# Patient Record
Sex: Female | Born: 1950
Health system: Southern US, Community
[De-identification: ages and names within clinical notes are randomized; demographics above are authoritative.]

## PROBLEM LIST (undated history)

## (undated) DIAGNOSIS — G43909 Migraine, unspecified, not intractable, without status migrainosus: Secondary | ICD-10-CM

## (undated) DIAGNOSIS — M5030 Other cervical disc degeneration, unspecified cervical region: Secondary | ICD-10-CM

## (undated) DIAGNOSIS — K589 Irritable bowel syndrome without diarrhea: Secondary | ICD-10-CM

## (undated) DIAGNOSIS — F32A Depression, unspecified: Secondary | ICD-10-CM

## (undated) DIAGNOSIS — F419 Anxiety disorder, unspecified: Secondary | ICD-10-CM

## (undated) DIAGNOSIS — M21619 Bunion of unspecified foot: Secondary | ICD-10-CM

## (undated) DIAGNOSIS — M1990 Unspecified osteoarthritis, unspecified site: Secondary | ICD-10-CM

## (undated) DIAGNOSIS — Z9889 Other specified postprocedural states: Secondary | ICD-10-CM

## (undated) DIAGNOSIS — M810 Age-related osteoporosis without current pathological fracture: Secondary | ICD-10-CM

## (undated) DIAGNOSIS — H9319 Tinnitus, unspecified ear: Secondary | ICD-10-CM

## (undated) DIAGNOSIS — D131 Benign neoplasm of stomach: Secondary | ICD-10-CM

## (undated) DIAGNOSIS — D509 Iron deficiency anemia, unspecified: Secondary | ICD-10-CM

## (undated) DIAGNOSIS — N840 Polyp of corpus uteri: Secondary | ICD-10-CM

## (undated) DIAGNOSIS — L680 Hirsutism: Secondary | ICD-10-CM

## (undated) DIAGNOSIS — N6019 Diffuse cystic mastopathy of unspecified breast: Secondary | ICD-10-CM

## (undated) DIAGNOSIS — R112 Nausea with vomiting, unspecified: Secondary | ICD-10-CM

## (undated) DIAGNOSIS — F329 Major depressive disorder, single episode, unspecified: Secondary | ICD-10-CM

## (undated) DIAGNOSIS — K219 Gastro-esophageal reflux disease without esophagitis: Secondary | ICD-10-CM

## (undated) DIAGNOSIS — E785 Hyperlipidemia, unspecified: Secondary | ICD-10-CM

## (undated) HISTORY — DX: Benign neoplasm of stomach: D13.1

## (undated) HISTORY — DX: Gastro-esophageal reflux disease without esophagitis: K21.9

## (undated) HISTORY — DX: Iron deficiency anemia, unspecified: D50.9

## (undated) HISTORY — DX: Unspecified osteoarthritis, unspecified site: M19.90

## (undated) HISTORY — DX: Diffuse cystic mastopathy of unspecified breast: N60.19

## (undated) HISTORY — PX: UPPER GASTROINTESTINAL ENDOSCOPY: SHX188

## (undated) HISTORY — DX: Migraine, unspecified, not intractable, without status migrainosus: G43.909

## (undated) HISTORY — DX: Bunion of unspecified foot: M21.619

## (undated) HISTORY — DX: Age-related osteoporosis without current pathological fracture: M81.0

## (undated) HISTORY — DX: Tinnitus, unspecified ear: H93.19

## (undated) HISTORY — DX: Irritable bowel syndrome, unspecified: K58.9

## (undated) HISTORY — PX: COLONOSCOPY: SHX174

## (undated) HISTORY — DX: Major depressive disorder, single episode, unspecified: F32.9

## (undated) HISTORY — DX: Depression, unspecified: F32.A

## (undated) HISTORY — DX: Other cervical disc degeneration, unspecified cervical region: M50.30

## (undated) HISTORY — DX: Hirsutism: L68.0

## (undated) HISTORY — PX: HYSTEROSCOPY: SHX211

## (undated) HISTORY — PX: EUS: SHX5427

## (undated) HISTORY — DX: Hyperlipidemia, unspecified: E78.5

## (undated) HISTORY — DX: Polyp of corpus uteri: N84.0

---

## 1987-12-28 HISTORY — PX: TUBAL LIGATION: SHX77

## 1993-12-27 HISTORY — PX: DILATION AND CURETTAGE OF UTERUS: SHX78

## 2000-02-01 ENCOUNTER — Encounter: Payer: Self-pay | Admitting: Family Medicine

## 2000-02-01 ENCOUNTER — Encounter: Admission: RE | Admit: 2000-02-01 | Discharge: 2000-02-01 | Payer: Self-pay | Admitting: Family Medicine

## 2001-03-08 ENCOUNTER — Other Ambulatory Visit: Admission: RE | Admit: 2001-03-08 | Discharge: 2001-03-08 | Payer: Self-pay | Admitting: *Deleted

## 2001-04-10 ENCOUNTER — Encounter: Payer: Self-pay | Admitting: *Deleted

## 2001-04-10 ENCOUNTER — Encounter: Admission: RE | Admit: 2001-04-10 | Discharge: 2001-04-10 | Payer: Self-pay | Admitting: *Deleted

## 2001-04-14 ENCOUNTER — Other Ambulatory Visit: Admission: RE | Admit: 2001-04-14 | Discharge: 2001-04-14 | Payer: Self-pay | Admitting: *Deleted

## 2001-04-14 ENCOUNTER — Encounter (INDEPENDENT_AMBULATORY_CARE_PROVIDER_SITE_OTHER): Payer: Self-pay | Admitting: Specialist

## 2002-03-20 ENCOUNTER — Other Ambulatory Visit: Admission: RE | Admit: 2002-03-20 | Discharge: 2002-03-20 | Payer: Self-pay | Admitting: *Deleted

## 2002-04-11 ENCOUNTER — Encounter: Admission: RE | Admit: 2002-04-11 | Discharge: 2002-04-11 | Payer: Self-pay | Admitting: *Deleted

## 2002-04-11 ENCOUNTER — Encounter: Payer: Self-pay | Admitting: *Deleted

## 2003-03-20 ENCOUNTER — Other Ambulatory Visit: Admission: RE | Admit: 2003-03-20 | Discharge: 2003-03-20 | Payer: Self-pay | Admitting: *Deleted

## 2003-06-20 ENCOUNTER — Encounter: Admission: RE | Admit: 2003-06-20 | Discharge: 2003-06-20 | Payer: Self-pay | Admitting: *Deleted

## 2003-06-20 ENCOUNTER — Encounter: Payer: Self-pay | Admitting: *Deleted

## 2004-03-10 ENCOUNTER — Encounter: Admission: RE | Admit: 2004-03-10 | Discharge: 2004-03-10 | Payer: Self-pay | Admitting: Family Medicine

## 2004-03-23 ENCOUNTER — Other Ambulatory Visit: Admission: RE | Admit: 2004-03-23 | Discharge: 2004-03-23 | Payer: Self-pay | Admitting: *Deleted

## 2004-06-22 ENCOUNTER — Ambulatory Visit (HOSPITAL_COMMUNITY): Admission: RE | Admit: 2004-06-22 | Discharge: 2004-06-22 | Payer: Self-pay | Admitting: *Deleted

## 2004-08-03 ENCOUNTER — Ambulatory Visit (HOSPITAL_COMMUNITY): Admission: RE | Admit: 2004-08-03 | Discharge: 2004-08-03 | Payer: Self-pay | Admitting: Internal Medicine

## 2005-03-10 ENCOUNTER — Other Ambulatory Visit: Admission: RE | Admit: 2005-03-10 | Discharge: 2005-03-10 | Payer: Self-pay | Admitting: *Deleted

## 2005-09-29 ENCOUNTER — Ambulatory Visit (HOSPITAL_COMMUNITY): Admission: RE | Admit: 2005-09-29 | Discharge: 2005-09-29 | Payer: Self-pay | Admitting: *Deleted

## 2006-04-06 ENCOUNTER — Other Ambulatory Visit: Admission: RE | Admit: 2006-04-06 | Discharge: 2006-04-06 | Payer: Self-pay | Admitting: *Deleted

## 2006-09-08 ENCOUNTER — Ambulatory Visit: Payer: Self-pay | Admitting: Internal Medicine

## 2006-09-21 ENCOUNTER — Ambulatory Visit (HOSPITAL_COMMUNITY): Admission: RE | Admit: 2006-09-21 | Discharge: 2006-09-21 | Payer: Self-pay | Admitting: *Deleted

## 2006-09-23 ENCOUNTER — Ambulatory Visit: Payer: Self-pay | Admitting: Internal Medicine

## 2006-10-05 ENCOUNTER — Ambulatory Visit (HOSPITAL_COMMUNITY): Admission: RE | Admit: 2006-10-05 | Discharge: 2006-10-05 | Payer: Self-pay | Admitting: *Deleted

## 2006-10-19 ENCOUNTER — Ambulatory Visit (HOSPITAL_COMMUNITY): Admission: RE | Admit: 2006-10-19 | Discharge: 2006-10-19 | Payer: Self-pay | Admitting: Gastroenterology

## 2006-10-25 ENCOUNTER — Ambulatory Visit: Payer: Self-pay | Admitting: Gastroenterology

## 2006-10-31 ENCOUNTER — Ambulatory Visit: Payer: Self-pay | Admitting: Internal Medicine

## 2007-04-11 ENCOUNTER — Other Ambulatory Visit: Admission: RE | Admit: 2007-04-11 | Discharge: 2007-04-11 | Payer: Self-pay | Admitting: *Deleted

## 2007-05-11 ENCOUNTER — Encounter: Admission: RE | Admit: 2007-05-11 | Discharge: 2007-05-11 | Payer: Self-pay | Admitting: Family Medicine

## 2007-05-17 ENCOUNTER — Encounter: Admission: RE | Admit: 2007-05-17 | Discharge: 2007-05-17 | Payer: Self-pay | Admitting: Family Medicine

## 2007-10-09 ENCOUNTER — Ambulatory Visit (HOSPITAL_COMMUNITY): Admission: RE | Admit: 2007-10-09 | Discharge: 2007-10-09 | Payer: Self-pay | Admitting: *Deleted

## 2008-03-16 ENCOUNTER — Emergency Department (HOSPITAL_COMMUNITY): Admission: EM | Admit: 2008-03-16 | Discharge: 2008-03-16 | Payer: Self-pay | Admitting: Emergency Medicine

## 2008-05-09 ENCOUNTER — Other Ambulatory Visit: Admission: RE | Admit: 2008-05-09 | Discharge: 2008-05-09 | Payer: Self-pay | Admitting: Gynecology

## 2008-10-09 ENCOUNTER — Ambulatory Visit (HOSPITAL_COMMUNITY): Admission: RE | Admit: 2008-10-09 | Discharge: 2008-10-09 | Payer: Self-pay | Admitting: Gynecology

## 2009-02-10 ENCOUNTER — Encounter: Admission: RE | Admit: 2009-02-10 | Discharge: 2009-02-10 | Payer: Self-pay | Admitting: Family Medicine

## 2009-07-30 ENCOUNTER — Other Ambulatory Visit: Admission: RE | Admit: 2009-07-30 | Discharge: 2009-07-30 | Payer: Self-pay | Admitting: Family Medicine

## 2009-08-06 ENCOUNTER — Ambulatory Visit (HOSPITAL_COMMUNITY): Admission: RE | Admit: 2009-08-06 | Discharge: 2009-08-06 | Payer: Self-pay | Admitting: Dermatology

## 2009-10-07 ENCOUNTER — Encounter (INDEPENDENT_AMBULATORY_CARE_PROVIDER_SITE_OTHER): Payer: Self-pay | Admitting: Obstetrics and Gynecology

## 2009-10-07 ENCOUNTER — Ambulatory Visit (HOSPITAL_COMMUNITY): Admission: RE | Admit: 2009-10-07 | Discharge: 2009-10-07 | Payer: Self-pay | Admitting: Obstetrics and Gynecology

## 2009-10-10 ENCOUNTER — Ambulatory Visit (HOSPITAL_COMMUNITY): Admission: RE | Admit: 2009-10-10 | Discharge: 2009-10-10 | Payer: Self-pay | Admitting: Family Medicine

## 2010-10-12 ENCOUNTER — Ambulatory Visit (HOSPITAL_COMMUNITY): Admission: RE | Admit: 2010-10-12 | Discharge: 2010-10-12 | Payer: Self-pay | Admitting: Family Medicine

## 2010-10-20 ENCOUNTER — Other Ambulatory Visit: Admission: RE | Admit: 2010-10-20 | Discharge: 2010-10-20 | Payer: Self-pay | Admitting: Family Medicine

## 2011-04-01 LAB — COMPREHENSIVE METABOLIC PANEL
Albumin: 4.5 g/dL (ref 3.5–5.2)
Alkaline Phosphatase: 44 U/L (ref 39–117)
BUN: 12 mg/dL (ref 6–23)
CO2: 31 mEq/L (ref 19–32)
Chloride: 106 mEq/L (ref 96–112)
GFR calc non Af Amer: >60 mL/min (ref >60)
Glucose, Bld: 78 mg/dL (ref 70–99)
Potassium: 4.7 mEq/L (ref 3.5–5.1)
Total Bilirubin: 0.9 mg/dL (ref 0.3–1.2)

## 2011-04-01 LAB — CBC
HCT: 41.9 % (ref 36.0–46.0)
Platelets: 167 10*3/uL (ref 150–400)
RBC: 4.8 MIL/uL (ref 3.87–5.11)
WBC: 5.3 10*3/uL (ref 4.0–10.5)

## 2011-05-14 NOTE — Assessment & Plan Note (Signed)
[facility]                           GASTROENTEROLOGY OFFICE NOTE   Audrey Powers, Audrey Powers                   MRN:          [id number]  DATE:09/08/2006                            DOB:          1951-02-16    REASON FOR CONSULTATION:  Recurrent abdominal pain, elevated amylase and  lipase.   ASSESSMENT:  1. Epigastric pain and left upper quadrant pain.  2. Chronically elevated amylase and lipase related to the pancreas, not      macroamylasemia.  3. Gallstones.  4. MRI and MRCP in 2005 suggestive of pancreas divisum.  She could have      chronic low-grade pancreatitis.   RECOMMENDATIONS AND PLAN:  1. EGD, as she has had increasing symptoms that have responded to      Prilosec.  Look for chronic esophageal damage, ulcer disease, tumor,      though this seems unlikely.  2. I think we may need to consider repeat imaging of the pancreas with an      MRI or MRCP.  3. She does not really seem to have discrete spells of severe pancreatitis      to suggest the need for cholecystectomy, though that is something to      keep in mind.   The risks, benefits, and indications of EGD are explained to the patient.   HISTORY:  Over the past several months, she has had to increase to two  Prilosec a day to control her epigastric and left upper quadrant pain which  does reasonably well.  She does not describe dysphagia or bleeding.  There  is no fever, chills, weight fluctuation, or weight loss.  No arthritic  symptoms.  She has not had discrete episodes of abdominal pain.  Several  months ago, she had nausea/vomiting problems and some diarrhea that her  husband had as well, and this is attributed to a virus.  Recent  investigation from Dr. Duaine Dredge has shown persistent elevation of amylase  and lipase in the past at a low-grade level.  Her CBC, CMET were normal on  August 16, 2006.  Total cholesterol 204, triglycerides 134, LDL 135.  Amylase 115, lipase 103,  with top normal being 99 and 59.  In March, amylase  was 108.  Her pancreatic fraction was 91%.  There was no macroamylase.  Her  triglycerides were 155 at that time, and her lipase was 84.  TSH normal.  Amylase 107, lipase 96 on August 23, 2005.  Lipase 101, amylase 96 on August 14, 2004.   MEDICATIONS:  1. Prilosec OTC 20 mg two a day.  2. Atenolol 100 mg daily.  3. Amitriptyline 10 mg daily.  4. Calcium 600 mg with vitamin D daily.  5. Simvastatin 10 mg daily.  6. Dicyclomine 10 mg as needed.  7. Maxalt p.r.n.   ALLERGIES:  CODEINE CAUSES NAUSEA AND VOMITING.  SHE IS SENSITIVE TO  ERYTHROMYCIN.   PAST MEDICAL HISTORY:  1. Chronic headaches.  2. Cholelithiasis (single large gallstone).  3. Osteoarthritis.  4. Prior tubal ligation.  5. Chronic elevation of amylase and lipase with suspected pancreas  divisum, as described above.  6. Suspected gastroesophageal reflux disease.   PHYSICAL EXAMINATION:  GENERAL:  Well-developed, well-nourished white woman  in no acute distress.  VITAL SIGNS:  Weight 122 pounds.  She was 116 pounds in 2005.  Pulse 68,  blood pressure 102/52.  HEENT:  Eyes anicteric.  NECK:  Supple.  CHEST:  Clear, resonant.  HEART:  S1 and S2.  No rubs or gallops.  EXTREMITIES:  Without edema in the lower extremities.  ABDOMEN:  Soft, nontender, without organomegaly, mass, or hernia.  LYMPHATIC:  No neck or supraclavicular nodes.  MUSCULOSKELETAL:  There is no CVA tenderness.  PSYCHIATRIC:  She is alert and oriented x3.   Please see my medical history and physical form for full details of this  visit.  This is filed in the chart.   I appreciate the opportunity to care for this patient.                                   Iva Boop, MD,FACG   CEG/MedQ  DD:  09/08/2006  DT:  09/09/2006  Job #:  [id number]   cc:   Mosetta Putt, M.D.

## 2011-05-14 NOTE — Assessment & Plan Note (Signed)
[facility]                           GASTROENTEROLOGY OFFICE NOTE   KIDADA, GING                   MRN:          [id number]  DATE:10/31/2006                            DOB:          1951-07-22    Followup of abdominal pain.  Elevated amylase, lipase.   [name] is doing well at this time.  Dr. Duaine Dredge has had her taper  from 2 to 1 Prilosec OTC a day.  Her other medications are listed in the  chart.  She has had mild chronic elevation of amylase and lipase of unclear  etiology.  I had thought she might have a pancreas divisum based on an MRCP.  She underwent endoscopic ultrasound of the pancreas on October 19, 2006 by  Dr. Christella Hartigan and it looked normal, without evidence of divisum.  There is no  mass, no pancreatitis.   I have explained this to her.  She is reassured.  She is feeling well at  this time.   Weight 119 pounds.  Pulse 64, blood pressure 98/58.   ASSESSMENT:  Abnormal pancreatic enzymes of unclear significance.  Abdominal  pain in the epigastrium and left upper quadrant resolved.   She probably has at least some functional bowel disorder.  As far as her  elevated enzymes, I am at a loss to explain this but there does not seem to  be a significant etiology and I would not test this further at this time.  Perhaps these are normal values for her and that she lies somewhat outside  the upper limits of normal standard deviations that are included in normal  tests.  They do seem to be of pancreatic origin based upon isoenzymes and  she does not seem to have macroamylasemia.  She has gallstones but I do not  think these have been symptomatic.   I will see her back as needed. She may try to get off of Prilosec completely  by tapering off, as that is not an essential medicine.     Iva Boop, MD,FACG  Electronically Signed    CEG/MedQ  DD: 10/31/2006  DT: 11/01/2006  Job #: [id number]   cc:   Mosetta Putt, M.D.

## 2011-09-17 ENCOUNTER — Other Ambulatory Visit (HOSPITAL_COMMUNITY): Payer: Self-pay | Admitting: Family Medicine

## 2011-09-17 DIAGNOSIS — Z1231 Encounter for screening mammogram for malignant neoplasm of breast: Secondary | ICD-10-CM

## 2011-10-15 ENCOUNTER — Ambulatory Visit (HOSPITAL_COMMUNITY)
Admission: RE | Admit: 2011-10-15 | Discharge: 2011-10-15 | Disposition: A | Discharge status: Home / Self Care | Payer: BC Managed Care – PPO | Source: Ambulatory Visit | Attending: Family Medicine | Admitting: Family Medicine

## 2011-10-15 DIAGNOSIS — Z1231 Encounter for screening mammogram for malignant neoplasm of breast: Secondary | ICD-10-CM | POA: Insufficient documentation

## 2011-11-01 ENCOUNTER — Other Ambulatory Visit: Payer: Self-pay | Admitting: Family Medicine

## 2011-11-01 ENCOUNTER — Other Ambulatory Visit (HOSPITAL_COMMUNITY)
Admission: RE | Admit: 2011-11-01 | Discharge: 2011-11-01 | Disposition: A | Discharge status: Home / Self Care | Payer: BC Managed Care – PPO | Source: Ambulatory Visit | Attending: Family Medicine | Admitting: Family Medicine

## 2011-11-01 DIAGNOSIS — Z0000 Encounter for general adult medical examination without abnormal findings: Secondary | ICD-10-CM | POA: Insufficient documentation

## 2012-09-13 ENCOUNTER — Other Ambulatory Visit (HOSPITAL_COMMUNITY): Payer: Self-pay | Admitting: Family Medicine

## 2012-09-13 DIAGNOSIS — Z1231 Encounter for screening mammogram for malignant neoplasm of breast: Secondary | ICD-10-CM

## 2012-10-16 ENCOUNTER — Ambulatory Visit (HOSPITAL_COMMUNITY)
Admission: RE | Admit: 2012-10-16 | Discharge: 2012-10-16 | Disposition: A | Discharge status: Home / Self Care | Payer: BC Managed Care – PPO | Source: Ambulatory Visit | Attending: Family Medicine | Admitting: Family Medicine

## 2012-10-16 DIAGNOSIS — Z1231 Encounter for screening mammogram for malignant neoplasm of breast: Secondary | ICD-10-CM

## 2012-12-01 ENCOUNTER — Other Ambulatory Visit (HOSPITAL_COMMUNITY)
Admission: RE | Admit: 2012-12-01 | Discharge: 2012-12-01 | Disposition: A | Discharge status: Home / Self Care | Payer: BC Managed Care – PPO | Source: Ambulatory Visit | Attending: Family Medicine | Admitting: Family Medicine

## 2012-12-01 ENCOUNTER — Other Ambulatory Visit: Payer: Self-pay | Admitting: Family Medicine

## 2012-12-01 DIAGNOSIS — Z0000 Encounter for general adult medical examination without abnormal findings: Secondary | ICD-10-CM | POA: Insufficient documentation

## 2013-09-17 ENCOUNTER — Other Ambulatory Visit (HOSPITAL_COMMUNITY): Payer: Self-pay | Admitting: Family Medicine

## 2013-09-17 DIAGNOSIS — Z1231 Encounter for screening mammogram for malignant neoplasm of breast: Secondary | ICD-10-CM

## 2013-10-19 ENCOUNTER — Ambulatory Visit (HOSPITAL_COMMUNITY)
Admission: RE | Admit: 2013-10-19 | Discharge: 2013-10-19 | Disposition: A | Discharge status: Home / Self Care | Payer: BC Managed Care – PPO | Source: Ambulatory Visit | Attending: Family Medicine | Admitting: Family Medicine

## 2013-10-19 DIAGNOSIS — Z1231 Encounter for screening mammogram for malignant neoplasm of breast: Secondary | ICD-10-CM | POA: Insufficient documentation

## 2014-03-05 ENCOUNTER — Other Ambulatory Visit: Payer: Self-pay | Admitting: Dermatology

## 2014-05-23 ENCOUNTER — Encounter: Payer: Self-pay | Admitting: Internal Medicine

## 2014-05-30 ENCOUNTER — Other Ambulatory Visit (HOSPITAL_COMMUNITY)
Admission: RE | Admit: 2014-05-30 | Discharge: 2014-05-30 | Disposition: A | Discharge status: Home / Self Care | Payer: BC Managed Care – PPO | Source: Ambulatory Visit | Attending: Family Medicine | Admitting: Family Medicine

## 2014-05-30 ENCOUNTER — Other Ambulatory Visit: Payer: Self-pay | Admitting: Family Medicine

## 2014-05-30 DIAGNOSIS — Z0000 Encounter for general adult medical examination without abnormal findings: Secondary | ICD-10-CM | POA: Insufficient documentation

## 2014-05-31 LAB — CYTOLOGY - PAP

## 2014-06-03 ENCOUNTER — Encounter: Payer: Self-pay | Admitting: Internal Medicine

## 2014-06-04 ENCOUNTER — Encounter: Payer: Self-pay | Admitting: Internal Medicine

## 2014-08-05 ENCOUNTER — Ambulatory Visit (AMBULATORY_SURGERY_CENTER): Payer: Medicare Other | Admitting: *Deleted

## 2014-08-05 VITALS — Ht 62.0 in | Wt 118.8 lb

## 2014-08-05 DIAGNOSIS — Z1211 Encounter for screening for malignant neoplasm of colon: Secondary | ICD-10-CM

## 2014-08-05 MED ORDER — NA SULFATE-K SULFATE-MG SULF 17.5-3.13-1.6 GM/177ML PO SOLN: ORAL | Status: DC

## 2014-08-05 NOTE — Progress Notes (Signed)
No allergies to eggs or soy. No problems with anesthesia.  Pt given Emmi instructions for colonoscopy  No oxygen use  No diet drug use  

## 2014-08-09 ENCOUNTER — Encounter: Payer: Self-pay | Admitting: Internal Medicine

## 2014-08-19 ENCOUNTER — Ambulatory Visit (AMBULATORY_SURGERY_CENTER): Payer: Medicare Other | Admitting: Internal Medicine

## 2014-08-19 ENCOUNTER — Encounter: Payer: Self-pay | Admitting: Internal Medicine

## 2014-08-19 VITALS — BP 116/62 | HR 63 | Temp 98.6°F | Resp 19 | Ht 62.0 in | Wt 118.0 lb

## 2014-08-19 DIAGNOSIS — Z1211 Encounter for screening for malignant neoplasm of colon: Secondary | ICD-10-CM

## 2014-08-19 MED ORDER — SODIUM CHLORIDE 0.9 % IV SOLN: 500.0000 mL | INTRAVENOUS | Status: DC

## 2014-08-19 NOTE — Op Note (Signed)
[facility] [place]  [address] [place], [address]   COLONOSCOPY PROCEDURE REPORT  PATIENT: Audrey Powers, Audrey Powers  MR#: [id number] BIRTHDATE: 1951-01-19 , 29  yrs. old GENDER: Female ENDOSCOPIST: Gatha Mayer, MD, [facility] PROCEDURE DATE:  08/19/2014 PROCEDURE:   Colonoscopy, surveillance First Screening Colonoscopy - Avg.  risk and is 50 yrs.  old or older - No.  Prior Negative Screening - Now for repeat screening. N/A  History of Adenoma - Now for follow-up colonoscopy & has been > or = to 3 yrs.  N/A  Polyps Removed Today? No.  Recommend repeat exam, <10 yrs? No. ASA CLASS:   Class II INDICATIONS:average risk screening and Last colonoscopy performed 10 years ago. MEDICATIONS: Propofol (Diprivan) 120 mg IV, MAC sedation, administered by CRNA, and These medications were titrated to patient response per physician's verbal order  DESCRIPTION OF PROCEDURE:   After the risks benefits and alternatives of the procedure were thoroughly explained, informed consent was obtained.  A digital rectal exam revealed no abnormalities of the rectum.   The LB [id number]  endoscope was introduced through the anus and advanced to the cecum, which was identified by both the appendix and ileocecal valve. No adverse events experienced.   The quality of the prep was excellent using Suprep  The instrument was then slowly withdrawn as the colon was fully examined.      COLON FINDINGS: A normal appearing cecum, ileocecal valve, and appendiceal orifice were identified.  The ascending, hepatic flexure, transverse, splenic flexure, descending, sigmoid colon and rectum appeared unremarkable.  No polyps or cancers were seen.   A right colon retroflexion was performed.  Retroflexed views revealed no abnormalities. The time to cecum=3 minutes 45 seconds. Withdrawal time=8 minutes 40 seconds.  The scope was withdrawn and the procedure completed. COMPLICATIONS: There were no  complications.  ENDOSCOPIC IMPRESSION: Normal colonoscopy - excellent prep  RECOMMENDATIONS: Repeat colonoscopy 10 years - 2025   eSigned:  Gatha Mayer, MD, [facility] 08/19/2014 9:07 AM   cc: Harlan Stains, MD and The Patient

## 2014-08-19 NOTE — Progress Notes (Signed)
No problems noted in the recovery room. maw 

## 2014-08-19 NOTE — Progress Notes (Signed)
A/ox3 pleased with MAC, report to Annette RN 

## 2014-08-19 NOTE — Patient Instructions (Addendum)
No polyps today!  Next routine colonoscopy in 10 years - 2025   I appreciate the opportunity to care for you. Gatha Mayer, MD, FACG    YOU HAD AN ENDOSCOPIC PROCEDURE TODAY AT [facility]: Refer to the procedure report that was given to you for any specific questions about what was found during the examination.  If the procedure report does not answer your questions, please call your gastroenterologist to clarify.  If you requested that your care partner not be given the details of your procedure findings, then the procedure report has been included in a sealed envelope for you to review at your convenience later.  YOU SHOULD EXPECT: Some feelings of bloating in the abdomen. Passage of more gas than usual.  Walking can help get rid of the air that was put into your GI tract during the procedure and reduce the bloating. If you had a lower endoscopy (such as a colonoscopy or flexible sigmoidoscopy) you may notice spotting of blood in your stool or on the toilet paper. If you underwent a bowel prep for your procedure, then you may not have a normal bowel movement for a few days.  DIET: Your first meal following the procedure should be a light meal and then it is ok to progress to your normal diet.  A half-sandwich or bowl of soup is an example of a good first meal.  Heavy or fried foods are harder to digest and may make you feel nauseous or bloated.  Likewise meals heavy in dairy and vegetables can cause extra gas to form and this can also increase the bloating.  Drink plenty of fluids but you should avoid alcoholic beverages for 24 hours.  ACTIVITY: Your care partner should take you home directly after the procedure.  You should plan to take it easy, moving slowly for the rest of the day.  You can resume normal activity the day after the procedure however you should NOT DRIVE or use heavy machinery for 24 hours (because of the sedation medicines used during the test).    SYMPTOMS  TO REPORT IMMEDIATELY: A gastroenterologist can be reached at any hour.  During normal business hours, 8:30 AM to 5:00 PM Monday through Friday, call [phone].  After hours and on weekends, please call the GI answering service at [phone] who will take a message and have the physician on call contact you.   Following lower endoscopy (colonoscopy or flexible sigmoidoscopy):  Excessive amounts of blood in the stool  Significant tenderness or worsening of abdominal pains  Swelling of the abdomen that is new, acute  Fever of 100F or higher FOLLOW UP: If any biopsies were taken you will be contacted by phone or by letter within the next 1-3 weeks.  Call your gastroenterologist if you have not heard about the biopsies in 3 weeks.  Our staff will call the home number listed on your records the next business day following your procedure to check on you and address any questions or concerns that you may have at that time regarding the information given to you following your procedure. This is a courtesy call and so if there is no answer at the home number and we have not heard from you through the emergency physician on call, we will assume that you have returned to your regular daily activities without incident.  SIGNATURES/CONFIDENTIALITY: You and/or your care partner have signed paperwork which will be entered into your electronic medical record.  These  signatures attest to the fact that that the information above on your After Visit Summary has been reviewed and is understood.  Full responsibility of the confidentiality of this discharge information lies with you and/or your care-partner.     Repeat next colonoscopy in 10 years! You may resume your current medications today. Please call if any questions or concerns.

## 2014-08-20 ENCOUNTER — Telehealth: Payer: Self-pay | Admitting: *Deleted

## 2014-08-20 NOTE — Telephone Encounter (Signed)
  Follow up Call-  Call back number 08/19/2014  Post procedure Call Back phone  # [phone]  Permission to leave phone message Yes     Patient questions:  Do you have a fever, pain , or abdominal swelling? No. Pain Score  0 *  Have you tolerated food without any problems? Yes.    Have you been able to return to your normal activities? Yes.    Do you have any questions about your discharge instructions: Diet   No. Medications  No. Follow up visit  No.  Do you have questions or concerns about your Care? No.  Actions: * If pain score is 4 or above: No action needed, pain <4.

## 2014-10-03 ENCOUNTER — Other Ambulatory Visit (HOSPITAL_COMMUNITY): Payer: Self-pay | Admitting: Family Medicine

## 2014-10-03 DIAGNOSIS — Z1231 Encounter for screening mammogram for malignant neoplasm of breast: Secondary | ICD-10-CM

## 2014-10-22 ENCOUNTER — Other Ambulatory Visit (HOSPITAL_COMMUNITY): Payer: Self-pay | Admitting: Family Medicine

## 2014-10-22 ENCOUNTER — Ambulatory Visit (HOSPITAL_COMMUNITY)
Admission: RE | Admit: 2014-10-22 | Discharge: 2014-10-22 | Disposition: A | Discharge status: Home / Self Care | Payer: Medicare Other | Source: Ambulatory Visit | Attending: Family Medicine | Admitting: Family Medicine

## 2014-10-22 DIAGNOSIS — Z1231 Encounter for screening mammogram for malignant neoplasm of breast: Secondary | ICD-10-CM

## 2015-09-24 ENCOUNTER — Other Ambulatory Visit: Payer: Self-pay

## 2015-09-24 DIAGNOSIS — Z1231 Encounter for screening mammogram for malignant neoplasm of breast: Secondary | ICD-10-CM

## 2015-10-27 ENCOUNTER — Ambulatory Visit
Admission: RE | Admit: 2015-10-27 | Discharge: 2015-10-27 | Disposition: A | Discharge status: Home / Self Care | Payer: Medicare Other | Source: Ambulatory Visit

## 2015-10-27 DIAGNOSIS — Z1231 Encounter for screening mammogram for malignant neoplasm of breast: Secondary | ICD-10-CM

## 2016-06-11 ENCOUNTER — Other Ambulatory Visit: Payer: Self-pay | Admitting: Family Medicine

## 2016-06-11 DIAGNOSIS — R1010 Upper abdominal pain, unspecified: Secondary | ICD-10-CM

## 2016-06-18 ENCOUNTER — Ambulatory Visit
Admission: RE | Admit: 2016-06-18 | Discharge: 2016-06-18 | Disposition: A | Discharge status: Home / Self Care | Payer: Medicare Other | Source: Ambulatory Visit | Attending: Family Medicine | Admitting: Family Medicine

## 2016-06-18 DIAGNOSIS — R1010 Upper abdominal pain, unspecified: Secondary | ICD-10-CM

## 2016-06-24 ENCOUNTER — Telehealth: Payer: Self-pay | Admitting: Internal Medicine

## 2016-06-24 NOTE — Telephone Encounter (Signed)
Pt has been added to the wait list and told to call back if symptoms worsen.

## 2016-08-27 DIAGNOSIS — D131 Benign neoplasm of stomach: Secondary | ICD-10-CM

## 2016-08-27 HISTORY — DX: Benign neoplasm of stomach: D13.1

## 2016-08-31 ENCOUNTER — Other Ambulatory Visit (INDEPENDENT_AMBULATORY_CARE_PROVIDER_SITE_OTHER): Payer: Medicare Other

## 2016-08-31 ENCOUNTER — Encounter: Payer: Self-pay | Admitting: Internal Medicine

## 2016-08-31 ENCOUNTER — Encounter (INDEPENDENT_AMBULATORY_CARE_PROVIDER_SITE_OTHER): Payer: Self-pay

## 2016-08-31 ENCOUNTER — Ambulatory Visit (INDEPENDENT_AMBULATORY_CARE_PROVIDER_SITE_OTHER): Payer: Medicare Other | Admitting: Internal Medicine

## 2016-08-31 VITALS — BP 100/62 | HR 64 | Ht 62.0 in | Wt 114.6 lb

## 2016-08-31 DIAGNOSIS — R140 Abdominal distension (gaseous): Secondary | ICD-10-CM

## 2016-08-31 DIAGNOSIS — R6881 Early satiety: Secondary | ICD-10-CM | POA: Diagnosis not present

## 2016-08-31 DIAGNOSIS — R634 Abnormal weight loss: Secondary | ICD-10-CM | POA: Diagnosis not present

## 2016-08-31 LAB — IGA: IgA: 266 mg/dL (ref 68–378)

## 2016-08-31 NOTE — Progress Notes (Signed)
Assessment & Plan:   1. Loss of weight   2. Early satiety   3. Bloating    These problems sound most like recurrent functional dyspepsia. It is certainly possible that she could have some mild pancreatic insufficiency, we did not turn up any problems 10 years ago on an evaluation at least structurally. Celiac disease is possible, so we'll test for that. Turn about the difference in the symptoms compared to before and think she somewhat tender in the epigastrium at times as well. I did not find that on exam today.  Plans are for: #1 test for celiac disease with IgA antibody and TTG antibody IgA  #2 schedule upper GI endoscopy to evaluate for structural problems that could cause this.The risks and benefits as well as alternatives of endoscopic procedure(s) have been discussed and reviewed. All questions answered. The patient agrees to proceed.   #3 pending these results consider treating with buspirone which can help functional dyspepsia. I discussed the rationale for this today. She does have some situational stress as well, mother died years so ago husband had a stroke.     Subjective:    Patient ID: Audrey Powers, female    DOB: [birth year], 65 y.o.   MRN: [id number] Chief complaint: Bloating HPI The patient is here because of bloating and fullness when she eats. She has lost about 4 pounds this summer also. She tells me and told her primary care provider, that in the summertime she seems to get more bloating and early satiety feelings. Greasy foods make it worse. She has changed her diet and is eating less of those. His problems have been there off and on for a number of years but there is some tenderness and more intensity to the symptoms. She is concerned about them. Back in 2005-2007 I had seen her for elevated lipase and amylase, imaging did not reveal any particular problems, EUS was done and showed a normal pancreas. Her white as evaluated her in the office and I reviewed that  note of 06/18/2016, she had normal CBC, trivial elevation of total bilirubin on a metabolic panel but otherwise normal, a cholesterol of 138 and a normal TSH of 0.97 on that date. She uses dicyclomine affect for cramping in her abdomen, she carries a diagnosis of irritable bowel syndrome. She has not been tested for celiac disease in the past. Screening colonoscopy up-to-date 2015 was negative. Abdominal ultrasound in June showed gallbladder sludge and a kidney cyst. She is on daily pantoprazole. In the past year she has tried a probiotic and does not seem to think it helped the symptoms. Wt Readings from Last 3 Encounters:  08/31/16 114 lb 9.6 oz (52 kg)  08/19/14 118 lb (53.5 kg)  08/05/14 118 lb 12.8 oz (53.9 kg)   Allergies  Allergen Reactions  . Codeine Nausea And Vomiting  . Erythromycin Nausea Only  . Sumatriptan Other (See Comments)    Not effective  . Topamax [Topiramate] Other (See Comments)    dizzy   Outpatient Medications Prior to Visit  Medication Sig Dispense Refill  . b complex vitamins tablet Take 1 tablet by mouth daily.    . Biotin 1000 MCG tablet Take 1,000 mcg by mouth daily.    . CYCLOBENZAPRINE HCL PO Take by mouth as needed.    . diclofenac sodium (VOLTAREN) 1 % GEL Apply topically as needed.    . dicyclomine (BENTYL) 10 MG capsule Take 10 mg by mouth as needed for spasms.    Marland Kitchen  Multiple Vitamin (MULTIVITAMIN) tablet Take 1 tablet by mouth daily.    . Omega-3 Fatty Acids (OMEGA-3 FISH OIL) 1200 MG CAPS Take by mouth daily.    . pantoprazole (PROTONIX) 40 MG tablet Take 40 mg by mouth daily.    . pravastatin (PRAVACHOL) 20 MG tablet Take 20 mg by mouth daily.    . propranolol (INDERAL) 20 MG tablet Take 20 mg by mouth 2 (two) times daily.    . rizatriptan (MAXALT) 10 MG tablet Take 10 mg by mouth as needed for migraine. May repeat in 2 hours if needed    . amitriptyline (ELAVIL) 10 MG tablet Take 10 mg by mouth at bedtime as needed for sleep.    . Calcium  Carb-Cholecalciferol (CALCIUM 600 + D PO) Take by mouth daily.     No facility-administered medications prior to visit.    Past Medical History:  Diagnosis Date  . Arthritis   . Bunion   . DDD (degenerative disc disease), cervical   . Depression   . Endometrial polyp   . Fibrocystic breast changes   . GERD (gastroesophageal reflux disease)   . Headache, migraine   . Hirsutism   . Hyperlipidemia   . IBS (irritable bowel syndrome)   . Iron deficiency anemia   . Osteoarthritis   . Osteoporosis   . Tinnitus    Past Surgical History:  Procedure Laterality Date  . COLONOSCOPY    . DILATION AND CURETTAGE OF UTERUS  1995   x 2  . EUS    . HYSTEROSCOPY    . TUBAL LIGATION  1989  . UPPER GASTROINTESTINAL ENDOSCOPY     Social History   Social History  . Marital status: Married    Spouse name: N/A  . Number of children: 2  . Years of education: N/A   Occupational History  . retired Pharmacist, hospital    Social History Main Topics  . Smoking status: Former Smoker    Quit date: 12/27/1966  . Smokeless tobacco: Never Used  . Alcohol use No  . Drug use: No   Family History  Problem Relation Age of Onset  . Hypertension Mother   . CVA Mother   . COPD Mother   . Pulmonary fibrosis Mother   . Hypertension Father   . GER disease Sister   . Hyperlipidemia Sister   . CVA Maternal Grandmother   . Hypertension Maternal Grandmother   . Arthritis Maternal Grandmother   . CVA Maternal Grandfather   . Hypertension Maternal Grandfather   . Colon cancer Neg Hx      Review of Systems Some insomniaEspecially since her mother died a year ago. There's been some stress and anxiety about her husband who had a cerebellar stroke earlier this year though he is improving. She has used Xanax or cyclobenzaprine to help sleep. She has joint pains in her neck and her right hand but not diffusely.    Objective:   Physical Exam @BP  100/62 (BP Location: Left Arm, Patient Position: Sitting, Cuff Size:  Normal)   Pulse 64   Ht 5\' 2"  (1.575 m)   Wt 114 lb 9.6 oz (52 kg)   BMI 20.96 kg/m @  General:  NAD Eyes:   anicteric Heart::  S1S2 no rubs, murmurs or gallops Abdomen:  soft and nontender, BS+ Chest wall: Lower bilateral anterior and lateral aspects of the ribs are nontender Ext:   no edema, cyanosis or clubbing     Data Reviewed:   As per history  of present illness    Abd [place] 06/18/16 IMPRESSION: 1.  Small amount of sludge noted within the gallbladder.  2.  9 mm simple cyst right kidney.  I appreciate the opportunity to care for this patient. CC: Vidal Schwalbe, MD

## 2016-08-31 NOTE — Patient Instructions (Signed)
  You have been scheduled for an endoscopy. Please follow written instructions given to you at your visit today. If you use inhalers (even only as needed), please bring them with you on the day of your procedure.   Your physician has requested that you go to the basement for the following lab work before leaving today: TTG, IGA   I appreciate the opportunity to care for you. Carl Gessner, MD, FACG 

## 2016-09-01 LAB — TISSUE TRANSGLUTAMINASE, IGA: TISSUE TRANSGLUTAMINASE AB, IGA: 1 U/mL (ref <4)

## 2016-09-01 NOTE — Progress Notes (Signed)
Negative for celiac dz Will discuss 9/8 EGD

## 2016-09-03 ENCOUNTER — Encounter: Payer: Self-pay | Admitting: Internal Medicine

## 2016-09-03 ENCOUNTER — Ambulatory Visit (AMBULATORY_SURGERY_CENTER): Payer: Medicare Other | Admitting: Internal Medicine

## 2016-09-03 VITALS — BP 100/57 | HR 61 | Temp 97.8°F | Resp 19 | Ht 62.0 in | Wt 114.0 lb

## 2016-09-03 DIAGNOSIS — K317 Polyp of stomach and duodenum: Secondary | ICD-10-CM

## 2016-09-03 DIAGNOSIS — R634 Abnormal weight loss: Secondary | ICD-10-CM

## 2016-09-03 MED ORDER — SODIUM CHLORIDE 0.9 % IV SOLN: 500.0000 mL | INTRAVENOUS | Status: DC

## 2016-09-03 MED ORDER — BUSPIRONE HCL 10 MG PO TABS: 10.0000 mg | ORAL_TABLET | Freq: Two times a day (BID) | ORAL | 5 refills | Status: DC

## 2016-09-03 NOTE — Op Note (Signed)
[place] Patient Name: Audrey Powers Procedure Date: 09/03/2016 3:31 PM MRN: [id number] Endoscopist: Gatha Mayer , MD Age: 65 Referring MD:  Date of Birth: [birth year] Gender: Female Account #: [id number] Procedure:                Upper GI endoscopy Indications:              Dyspepsia, Weight loss Medicines:                Propofol per Anesthesia, Monitored Anesthesia Care Procedure:                Pre-Anesthesia Assessment:                           - Prior to the procedure, a History and Physical                            was performed, and patient medications and                            allergies were reviewed. The patient's tolerance of                            previous anesthesia was also reviewed. The risks                            and benefits of the procedure and the sedation                            options and risks were discussed with the patient.                            All questions were answered, and informed consent                            was obtained. Prior Anticoagulants: The patient has                            taken no previous anticoagulant or antiplatelet                            agents. ASA Grade Assessment: II - A patient with                            mild systemic disease. After reviewing the risks                            and benefits, the patient was deemed in                            satisfactory condition to undergo the procedure.                           After obtaining informed consent, the endoscope was  passed under direct vision. Throughout the                            procedure, the patient's blood pressure, pulse, and                            oxygen saturations were monitored continuously. The                            Model GIF-[id number]) scope was introduced                            through the mouth, and advanced to the second part                            of  duodenum. The upper GI endoscopy was                            accomplished without difficulty. The patient                            tolerated the procedure well. Scope In: Scope Out: Findings:                 Multiple 5 to 8 mm semi-sessile polyps with no                            stigmata of recent bleeding were found in the                            gastric body. Biopsies were taken with a cold                            forceps for histology. Estimated blood loss was                            minimal.                           The exam was otherwise without abnormality.                           The cardia and gastric fundus were normal on                            retroflexion. Complications:            No immediate complications. Estimated Blood Loss:     Estimated blood loss was minimal. Impression:               - Multiple gastric polyps. Biopsied.                           - The examination was otherwise normal. Recommendation:           - Patient has a contact number available for  emergencies. The signs and symptoms of potential                            delayed complications were discussed with the                            patient. Return to normal activities tomorrow.                            Written discharge instructions were provided to the                            patient.                           - Resume previous diet.                           - Continue present medications.                           - Await pathology results.                           - Will start buspirone 10 mg bid for functional                            dyspepsia - start qhs and move to bid Gatha Mayer, MD 09/03/2016 3:49:45 PM This report has been signed electronically.

## 2016-09-03 NOTE — Patient Instructions (Addendum)
There were some stomach polyps - common and not usually related to symptoms. I took biopsies of them but do not expect them to be related to your symptoms.  I am prescribing buspirone 10 mg tabs - take 10 mg nightly then try to add a daytime dose also after a few days. Sometimes it makes people sleepy - if so take a half - 5 mg.  .cegopp Gatha Mayer, MD, FACG   YOU HAD AN ENDOSCOPIC PROCEDURE TODAY AT [facility]:   Refer to the procedure report that was given to you for any specific questions about what was found during the examination.  If the procedure report does not answer your questions, please call your gastroenterologist to clarify.  If you requested that your care partner not be given the details of your procedure findings, then the procedure report has been included in a sealed envelope for you to review at your convenience later.  YOU SHOULD EXPECT: Some feelings of bloating in the abdomen. Passage of more gas than usual.  Walking can help get rid of the air that was put into your GI tract during the procedure and reduce the bloating. If you had a lower endoscopy (such as a colonoscopy or flexible sigmoidoscopy) you may notice spotting of blood in your stool or on the toilet paper. If you underwent a bowel prep for your procedure, you may not have a normal bowel movement for a few days.  Please Note:  You might notice some irritation and congestion in your nose or some drainage.  This is from the oxygen used during your procedure.  There is no need for concern and it should clear up in a day or so.  SYMPTOMS TO REPORT IMMEDIATELY:     Following upper endoscopy (EGD)  Vomiting of blood or coffee ground material  New chest pain or pain under the shoulder blades  Painful or persistently difficult swallowing  New shortness of breath  Fever of 100F or higher  Black, tarry-looking stools  For urgent or emergent issues, a gastroenterologist can be  reached at any hour by calling [phone].   DIET:  We do recommend a small meal at first, but then you may proceed to your regular diet.  Drink plenty of fluids but you should avoid alcoholic beverages for 24 hours.  ACTIVITY:  You should plan to take it easy for the rest of today and you should NOT DRIVE or use heavy machinery until tomorrow (because of the sedation medicines used during the test).    FOLLOW UP: Our staff will call the number listed on your records the next business day following your procedure to check on you and address any questions or concerns that you may have regarding the information given to you following your procedure. If we do not reach you, we will leave a message.  However, if you are feeling well and you are not experiencing any problems, there is no need to return our call.  We will assume that you have returned to your regular daily activities without incident.  If any biopsies were taken you will be contacted by phone or by letter within the next 1-3 weeks.  Please call us at [phone] if you have not heard about the biopsies in 3 weeks.    SIGNATURES/CONFIDENTIALITY: You and/or your care partner have signed paperwork which will be entered into your electronic medical record.  These signatures attest to the fact that that the information  above on your After Visit Summary has been reviewed and is understood.  Full responsibility of the confidentiality of this discharge information lies with you and/or your care-partner.   Await biopsy results  Pick up new RX at [facility]

## 2016-09-03 NOTE — Progress Notes (Signed)
A/ox3 pleased with MAC, report to Penny RN 

## 2016-09-03 NOTE — Progress Notes (Signed)
Called to room to assist during endoscopic procedure.  Patient ID and intended procedure confirmed with present staff. Received instructions for my participation in the procedure from the performing physician.  

## 2016-09-06 ENCOUNTER — Telehealth: Payer: Self-pay | Admitting: *Deleted

## 2016-09-06 NOTE — Telephone Encounter (Signed)
  Follow up Call-  Call back number 09/03/2016 08/19/2014  Post procedure Call Back phone  # [phone]  Permission to leave phone message Yes Yes  Some recent data might be hidden     Patient questions:  Do you have a fever, pain , or abdominal swelling? No. Pain Score  0 *  Have you tolerated food without any problems? Yes.    Have you been able to return to your normal activities? Yes.    Do you have any questions about your discharge instructions: Diet   No. Medications  No. Follow up visit  No.  Do you have questions or concerns about your Care? No.  Actions: * If pain score is 4 or above: No action needed, pain <4.  Pt started Buspirone as prescribed by Dr. Carlean Purl over the weekend.  She states that she had some slight dizziness and felt like her heart rate was fast.  She plans to take it a few more days to see if side effects improve but will call back if she cannot tolerate the medication.

## 2016-09-10 ENCOUNTER — Encounter: Payer: Self-pay | Admitting: Internal Medicine

## 2016-09-10 NOTE — Progress Notes (Signed)
Benign fundic gland polyps - no follow-up needed Letter sent

## 2016-09-21 ENCOUNTER — Encounter: Payer: Self-pay | Admitting: Internal Medicine

## 2016-10-08 ENCOUNTER — Other Ambulatory Visit: Payer: Self-pay | Admitting: Family Medicine

## 2016-10-08 DIAGNOSIS — Z1231 Encounter for screening mammogram for malignant neoplasm of breast: Secondary | ICD-10-CM

## 2016-10-21 ENCOUNTER — Other Ambulatory Visit: Payer: Self-pay | Admitting: Family Medicine

## 2016-10-21 DIAGNOSIS — M5431 Sciatica, right side: Secondary | ICD-10-CM

## 2016-10-28 ENCOUNTER — Ambulatory Visit
Admission: RE | Admit: 2016-10-28 | Discharge: 2016-10-28 | Disposition: A | Discharge status: Home / Self Care | Payer: Medicare Other | Source: Ambulatory Visit | Attending: Family Medicine | Admitting: Family Medicine

## 2016-10-28 DIAGNOSIS — Z1231 Encounter for screening mammogram for malignant neoplasm of breast: Secondary | ICD-10-CM

## 2016-11-05 ENCOUNTER — Ambulatory Visit
Admission: RE | Admit: 2016-11-05 | Discharge: 2016-11-05 | Disposition: A | Discharge status: Home / Self Care | Payer: Medicare Other | Source: Ambulatory Visit | Attending: Family Medicine | Admitting: Family Medicine

## 2016-11-05 DIAGNOSIS — M5431 Sciatica, right side: Secondary | ICD-10-CM

## 2017-09-13 ENCOUNTER — Other Ambulatory Visit: Payer: Self-pay | Admitting: Family Medicine

## 2017-09-13 DIAGNOSIS — Z1231 Encounter for screening mammogram for malignant neoplasm of breast: Secondary | ICD-10-CM

## 2017-10-31 ENCOUNTER — Ambulatory Visit
Admission: RE | Admit: 2017-10-31 | Discharge: 2017-10-31 | Disposition: A | Discharge status: Home / Self Care | Payer: Medicare Other | Source: Ambulatory Visit | Attending: Family Medicine | Admitting: Family Medicine

## 2017-10-31 DIAGNOSIS — Z1231 Encounter for screening mammogram for malignant neoplasm of breast: Secondary | ICD-10-CM

## 2018-03-13 ENCOUNTER — Other Ambulatory Visit: Payer: Self-pay | Admitting: Family Medicine

## 2018-03-13 DIAGNOSIS — M5090 Cervical disc disorder, unspecified, unspecified cervical region: Secondary | ICD-10-CM

## 2018-03-27 ENCOUNTER — Ambulatory Visit
Admission: RE | Admit: 2018-03-27 | Discharge: 2018-03-27 | Disposition: A | Discharge status: Home / Self Care | Payer: Medicare Other | Source: Ambulatory Visit | Attending: Family Medicine | Admitting: Family Medicine

## 2018-03-27 DIAGNOSIS — M5090 Cervical disc disorder, unspecified, unspecified cervical region: Secondary | ICD-10-CM

## 2018-09-04 ENCOUNTER — Encounter: Payer: Self-pay | Admitting: Internal Medicine

## 2018-09-04 ENCOUNTER — Ambulatory Visit: Payer: Medicare Other | Admitting: Internal Medicine

## 2018-09-04 VITALS — BP 100/60 | HR 68 | Ht 61.25 in | Wt 110.2 lb

## 2018-09-04 DIAGNOSIS — K648 Other hemorrhoids: Secondary | ICD-10-CM | POA: Insufficient documentation

## 2018-09-04 DIAGNOSIS — R198 Other specified symptoms and signs involving the digestive system and abdomen: Secondary | ICD-10-CM

## 2018-09-04 HISTORY — DX: Other hemorrhoids: K64.8

## 2018-09-04 NOTE — Patient Instructions (Addendum)
Long Term Prevention of Recurrent Hemorrhoids:   1. Fiber - Western diets are typically deficient in dietary fiber, and the addition of 15 - 20 gm. of fiber will help you have stools of a proper consistency, limiting your need to strain.  In addition to the use of raw oat or wheat bran, there are a number of commercial preparations that are available (Metamucil, Benefiber and Citrucel are just a few).    2. Fluids - It is important to have a sufficient amount of water intake during the day, in part to help the fiber "do its job".  Unless you have a medical condition that would prohibit it, a minimum of 6 - 8 glasses per day is important to help keep a regular bowel movement.  3. Do not strain - Many experts feel that chronic straining is one of the causes for the development of hemorrhoids.  Trying to limit yourself to two minutes on the commode may well limit your risk of recurrent hemorrhoids.  Also, do not try to "hold it" or avoid going to the bathroom when the urge is there.  These behavioral changes are thought to be very helpful in maintaining good bowel health.   High-Fiber Diet Fiber, also called dietary fiber, is a type of carbohydrate found in fruits, vegetables, whole grains, and beans. A high-fiber diet can have many health benefits. Your health care provider may recommend a high-fiber diet to help:  Prevent constipation. Fiber can make your bowel movements more regular.  Lower your cholesterol.  Relieve hemorrhoids, uncomplicated diverticulosis, or irritable bowel syndrome.  Prevent overeating as part of a weight-loss plan.  Prevent heart disease, type 2 diabetes, and certain cancers.  What is my plan? The recommended daily intake of fiber includes:  38 grams for men under age 32.  76 grams for men over age 20.  43 grams for women under age 31.  61 grams for women over age 3.  You can get the recommended daily intake of dietary fiber by eating a variety  of fruits, vegetables, grains, and beans. Your health care provider may also recommend a fiber supplement if it is not possible to get enough fiber through your diet. What do I need to know about a high-fiber diet?  Fiber supplements have not been widely studied for their effectiveness, so it is better to get fiber through food sources.  Always check the fiber content on thenutrition facts label of any prepackaged food. Look for foods that contain at least 5 grams of fiber per serving.  Ask your dietitian if you have questions about specific foods that are related to your condition, especially if those foods are not listed in the following section.  Increase your daily fiber consumption gradually. Increasing your intake of dietary fiber too quickly may cause bloating, cramping, or gas.  Drink plenty of water. Water helps you to digest fiber. What foods can I eat? Grains Whole-grain breads. Multigrain cereal. Oats and oatmeal. Brown rice. Barley. Bulgur wheat. [place]. Bran muffins. Popcorn. Rye wafer crackers. Vegetables Sweet potatoes. Spinach. Kale. Artichokes. Cabbage. Broccoli. Green peas. Carrots. Squash. Fruits Berries. Pears. Apples. Oranges. Avocados. Prunes and raisins. Dried figs. Meats and Other Protein Sources Navy, kidney, pinto, and soy beans. Split peas. Lentils. Nuts and seeds. Dairy Fiber-fortified yogurt. Beverages Fiber-fortified soy milk. Fiber-fortified orange juice. Other Fiber bars. The items listed above may not be a complete list of recommended foods or beverages. Contact your dietitian for more options. What foods are  not recommended? Grains White bread. Pasta made with refined flour. White rice. Vegetables Fried potatoes. Canned vegetables. Well-cooked vegetables. Fruits Fruit juice. Cooked, strained fruit. Meats and Other Protein Sources Fatty cuts of meat. Fried Sales executive or fried fish. Dairy Milk. Yogurt. Cream cheese. Sour cream. Beverages Soft  drinks. Other Cakes and pastries. Butter and oils. The items listed above may not be a complete list of foods and beverages to avoid. Contact your dietitian for more information. What are some tips for including high-fiber foods in my diet?  Eat a wide variety of high-fiber foods.  Make sure that half of all grains consumed each day are whole grains.  Replace breads and cereals made from refined flour or white flour with whole-grain breads and cereals.  Replace white rice with brown rice, bulgur wheat, or millet.  Start the day with a breakfast that is high in fiber, such as a cereal that contains at least 5 grams of fiber per serving.  Use beans in place of meat in soups, salads, or pasta.  Eat high-fiber snacks, such as berries, raw vegetables, nuts, or popcorn. This information is not intended to replace advice given to you by your health care provider. Make sure you discuss any questions you have with your health care provider. Document Released: 12/13/2005 Document Revised: 05/20/2016 Document Reviewed: 05/28/2014 Elsevier Interactive Patient Education  2018 [employer].  We are giving you a Benefiber handout to read and follow.  I appreciate the opportunity to care for you. Silvano Rusk, MD, [facility]

## 2018-09-04 NOTE — Progress Notes (Signed)
Audrey Powers 66 y.o. April 16, 1951 [id number]  Assessment & Plan:   Encounter Diagnoses  Name Primary?  . Internal and external prolapsed hemorrhoids Yes  . Change in bowel movement - scyballous stools    I think conservative treatment makes sense and she agrees.  Information regarding long-term treatment of hemorrhoids to prevent symptoms is provided.  We will change fiber supplementation to Benefiber 1 tablespoon or more daily.  Hopefully that will be more palatable and effective long-term.  High fiber diet information provided and asked to follow this.  As needed hydrocortisone cream but try to minimize use.  She is using it 2 or 3 times a month I do not think that the major issue hopefully that need will go away.  RTC prn, she knows to contact me through my chart or phone call if she needs further advice about treatment.  We could do a banding though it seems to me that she has inflamed symptomatic external hemorrhoids more so than internal though sometimes banding of the internal hemorrhoids can improve external symptoms.  I appreciate the opportunity to care for this patient. CC: Harlan Stains, MD   Subjective:   Chief Complaint: Hemorrhoids  HPI The patient is here with complaints of hemorrhoids.  She has problems with irritation itching and swelling and believes it is related to hemorrhoids.  The problems began this spring she saw Dr. Marcello Moores of surgery and she took Metamucil faithfully at a tablespoon a day trying to increase up to 3 times a day.  Her bowel habits improved, she has been having little small hard balls of stool, with some straining to stool, but she could not tolerate the Metamucil so she stopped.  She does think she eats a good high-fiber diet and tries to eat a lot of vegetables.  There is not any rectal pain or bleeding.  Last colonoscopy 2015 really normal.  Dr. Marcello Moores thought conservative measures made sense.  The patient does agree with that.  She  has used some hydrocortisone cream with relief at times.  Wt Readings from Last 3 Encounters:  09/04/18 110 lb 4 oz (50 kg)  09/03/16 114 lb (51.7 kg)  08/31/16 114 lb 9.6 oz (52 kg)    Allergies  Allergen Reactions  . Codeine Nausea And Vomiting  . Erythromycin Nausea Only  . Sumatriptan Other (See Comments)    Not effective  . Topamax [Topiramate] Other (See Comments)    dizzy  . Hydrocodone Nausea Only    dizziness   Current Meds  Medication Sig  . ALPRAZolam (XANAX) 0.25 MG tablet Take 0.25 mg by mouth 2 (two) times daily as needed for anxiety.  Marland Kitchen b complex vitamins tablet Take 1 tablet by mouth every other day.   . Biotin 1000 MCG tablet Take 1,000 mcg by mouth daily.  . Calcium Carbonate (CALCIUM 600 PO) Take 1 tablet by mouth every other day.  . clindamycin (CLEOCIN) 300 MG capsule Take 300 mg by mouth 3 (three) times daily.  . CYCLOBENZAPRINE HCL PO Take by mouth as needed.  Marland Kitchen denosumab (PROLIA) 60 MG/ML SOLN injection Inject 60 mg into the skin every 6 (six) months. Administer in upper arm, thigh, or abdomen  . diclofenac sodium (VOLTAREN) 1 % GEL Apply topically as needed.  . dicyclomine (BENTYL) 10 MG capsule Take 10 mg by mouth as needed for spasms.  . hydrocortisone 2.5 % cream Apply 1 application topically as needed.  . Multiple Vitamin (MULTIVITAMIN) tablet Take 1 tablet by  mouth daily.  . Omega-3 Fatty Acids (OMEGA-3 FISH OIL) 1200 MG CAPS Take by mouth daily.  . ondansetron (ZOFRAN) 4 MG tablet Take 1 tablet by mouth as needed.  . pravastatin (PRAVACHOL) 20 MG tablet Take 20 mg by mouth daily.  . propranolol (INDERAL) 20 MG tablet Take 20 mg by mouth 2 (two) times daily.  . rizatriptan (MAXALT) 10 MG tablet Take 10 mg by mouth as needed for migraine. May repeat in 2 hours if needed   Past Medical History:  Diagnosis Date  . Arthritis   . Bunion   . DDD (degenerative disc disease), cervical   . Depression   . Endometrial polyp   . Fibrocystic breast  changes   . Fundic gland polyps of stomach, benign 08/2016  . GERD (gastroesophageal reflux disease)   . Headache, migraine   . Hirsutism   . Hyperlipidemia   . IBS (irritable bowel syndrome)   . Iron deficiency anemia   . Osteoarthritis   . Osteoporosis   . Tinnitus    Past Surgical History:  Procedure Laterality Date  . COLONOSCOPY    . DILATION AND CURETTAGE OF UTERUS  1995   x 2  . EUS    . HYSTEROSCOPY    . TUBAL LIGATION  1989  . 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   Review of Systems As per HPI  Objective:   Physical Exam BP 100/60 (BP Location: Left Arm, Patient Position: Sitting, Cuff Size: Normal)   Pulse 68   Ht 5' 1.25" (1.556 m) Comment: height measured without shoes  Wt 110 lb 4 oz (50 kg)   BMI 20.66 kg/m   NAD  Timelie Horne MS3 present  Rectal:  Anoderm inspection revealed small fleshy tags  Digital exam revealed normal resting tone No mass or rectocele present. Nontender    Anoscopy was performed with the patient in the left lateral decubitus position while a chaperone was present and revealed inflamed Gr 2 external hemorrhoids and Gr 1 internal all positions

## 2018-09-20 ENCOUNTER — Other Ambulatory Visit: Payer: Self-pay | Admitting: Family Medicine

## 2018-09-20 DIAGNOSIS — Z1231 Encounter for screening mammogram for malignant neoplasm of breast: Secondary | ICD-10-CM

## 2018-10-10 ENCOUNTER — Other Ambulatory Visit: Payer: Self-pay | Admitting: Family Medicine

## 2018-10-10 ENCOUNTER — Ambulatory Visit
Admission: RE | Admit: 2018-10-10 | Discharge: 2018-10-10 | Disposition: A | Discharge status: Home / Self Care | Payer: Medicare Other | Source: Ambulatory Visit | Attending: Family Medicine | Admitting: Family Medicine

## 2018-10-10 DIAGNOSIS — M545 Low back pain, unspecified: Secondary | ICD-10-CM

## 2018-10-10 DIAGNOSIS — M549 Dorsalgia, unspecified: Secondary | ICD-10-CM

## 2018-11-02 ENCOUNTER — Ambulatory Visit
Admission: RE | Admit: 2018-11-02 | Discharge: 2018-11-02 | Disposition: A | Discharge status: Home / Self Care | Payer: Medicare Other | Source: Ambulatory Visit | Attending: Family Medicine | Admitting: Family Medicine

## 2018-11-02 DIAGNOSIS — Z1231 Encounter for screening mammogram for malignant neoplasm of breast: Secondary | ICD-10-CM

## 2018-11-29 ENCOUNTER — Other Ambulatory Visit: Payer: Self-pay | Admitting: Family Medicine

## 2018-11-30 ENCOUNTER — Other Ambulatory Visit: Payer: Self-pay | Admitting: Family Medicine

## 2018-11-30 DIAGNOSIS — M5134 Other intervertebral disc degeneration, thoracic region: Secondary | ICD-10-CM

## 2018-11-30 DIAGNOSIS — M5136 Other intervertebral disc degeneration, lumbar region: Secondary | ICD-10-CM

## 2018-11-30 DIAGNOSIS — M51369 Other intervertebral disc degeneration, lumbar region without mention of lumbar back pain or lower extremity pain: Secondary | ICD-10-CM

## 2018-12-15 ENCOUNTER — Ambulatory Visit
Admission: RE | Admit: 2018-12-15 | Discharge: 2018-12-15 | Disposition: A | Discharge status: Home / Self Care | Payer: Medicare Other | Source: Ambulatory Visit | Attending: Family Medicine | Admitting: Family Medicine

## 2018-12-15 DIAGNOSIS — M5136 Other intervertebral disc degeneration, lumbar region: Secondary | ICD-10-CM

## 2018-12-15 DIAGNOSIS — M5134 Other intervertebral disc degeneration, thoracic region: Secondary | ICD-10-CM

## 2019-06-14 ENCOUNTER — Telehealth: Payer: Self-pay | Admitting: Nurse Practitioner

## 2019-06-14 ENCOUNTER — Other Ambulatory Visit: Payer: Self-pay | Admitting: Physical Medicine and Rehabilitation

## 2019-06-14 DIAGNOSIS — M48062 Spinal stenosis, lumbar region with neurogenic claudication: Secondary | ICD-10-CM

## 2019-06-14 DIAGNOSIS — G9519 Other vascular myelopathies: Secondary | ICD-10-CM

## 2019-06-14 NOTE — Telephone Encounter (Signed)
Phone call to patient to verify medication list and allergies for myelogram procedure. Pt instructed to hold Maxalt for 48hrs prior to myelogram appointment time. Pt verbalized understanding. Pre and post procedure instructions reviewed with pt.

## 2019-07-11 ENCOUNTER — Other Ambulatory Visit: Payer: Self-pay | Admitting: Physical Medicine and Rehabilitation

## 2019-07-11 ENCOUNTER — Ambulatory Visit
Admission: RE | Admit: 2019-07-11 | Discharge: 2019-07-11 | Disposition: A | Discharge status: Home / Self Care | Payer: Medicare Other | Source: Ambulatory Visit | Attending: Physical Medicine and Rehabilitation | Admitting: Physical Medicine and Rehabilitation

## 2019-07-11 DIAGNOSIS — G9519 Other vascular myelopathies: Secondary | ICD-10-CM

## 2019-07-11 DIAGNOSIS — M48062 Spinal stenosis, lumbar region with neurogenic claudication: Secondary | ICD-10-CM

## 2019-07-11 MED ORDER — IOPAMIDOL (ISOVUE-M 200) INJECTION 41%
20.0000 mL | Freq: Once | INTRAMUSCULAR | Status: AC
  Administered 2019-07-11: 20 mL via INTRATHECAL

## 2019-07-11 MED ORDER — DIAZEPAM 5 MG PO TABS
5.0000 mg | ORAL_TABLET | Freq: Once | ORAL | Status: AC
  Administered 2019-07-11: 5 mg via ORAL

## 2019-07-11 NOTE — Progress Notes (Signed)
Pt presenting for myelogram procedure today. Pt reports she has been off of Maxalt for 48hrs for her procedure today.

## 2019-07-11 NOTE — Discharge Instructions (Signed)

## 2019-07-28 HISTORY — PX: LUMBAR FUSION: SHX111

## 2019-08-02 ENCOUNTER — Other Ambulatory Visit: Payer: Self-pay | Admitting: Neurosurgery

## 2019-08-16 ENCOUNTER — Other Ambulatory Visit: Payer: Self-pay | Admitting: Neurosurgery

## 2019-08-17 NOTE — Progress Notes (Signed)
[facility] [address] [place], [place] - [address] [place] [address] Phone: [phone] Fax: [place], [place] [address] Phone: [phone] Fax: [phone]    Your procedure is scheduled on Wednesday, August 26th.  Report to [facility] Main Entrance "A" at 9:40 A.M., and check in at the Admitting office.  Call this number if you have problems the morning of surgery:  [phone]  Call [phone] if you have any questions prior to your surgery date Monday-Friday 8am-4pm   Remember:  Do not eat or drink after midnight the night before your surgery   Take these medicines the morning of surgery with A SIP OF WATER  ALPRAZolam (XANAX)  gabapentin (NEURONTIN) pravastatin (PRAVACHOL)  propranolol (INDERAL)  If needed - cyclobenzaprine (FLEXERIL), dicyclomine (BENTYL), famotidine (PEPCID),  ondansetron (ZOFRAN), Polyethylene Glycol 400 (BLINK TEARS)/eye drops,  rizatriptan (MAXALT)   7 days prior to surgery STOP taking any Aspirin (unless otherwise instructed by your surgeon), diclofenac sodium (VOLTAREN) , Aleve, Naproxen, Ibuprofen, Motrin, Advil, Goody's, BC's, all herbal medications, fish oil, and all vitamins.   The Morning of Surgery  Do not wear jewelry, make-up or nail polish.  Do not wear lotions, powders, or perfumes/colognes, or deodorant  Do not shave 48 hours prior to surgery.  M Do not bring valuables to the hospital.   [facility] is not responsible for any belongings or valuables.  If you are a smoker, DO NOT Smoke 24 hours prior to surgery IF you wear a CPAP at night please bring your mask, tubing, and machine the morning of surgery   Remember that you must have someone to transport you home after your surgery, and remain with you for 24 hours if you are discharged the same day.  Contacts, glasses, hearing  aids, dentures or bridgework may not be worn into surgery.   Leave your suitcase in the car.  After surgery it may be brought to your room.  For patients admitted to the hospital, discharge time will be determined by your treatment team.  Patients discharged the day of surgery will not be allowed to drive home.   Special instructions:   [facility]- Preparing For Surgery  Before surgery, you can play an important role. Because skin is not sterile, your skin needs to be as free of germs as possible. You can reduce the number of germs on your skin by washing with CHG (chlorahexidine gluconate) Soap before surgery.  CHG is an antiseptic cleaner which kills germs and bonds with the skin to continue killing germs even after washing.    Oral Hygiene is also important to reduce your risk of infection.  Remember - BRUSH YOUR TEETH THE MORNING OF SURGERY WITH YOUR REGULAR TOOTHPASTE  Please do not use if you have an allergy to CHG or antibacterial soaps. If your skin becomes reddened/irritated stop using the CHG.  Do not shave (including legs and underarms) for at least 48 hours prior to first CHG shower. It is OK to shave your face.  Please follow these instructions carefully.   1. Shower the NIGHT BEFORE SURGERY and the MORNING OF SURGERY with CHG Soap.   2. If you chose to wash your hair, wash your hair first as usual with your normal shampoo.  3. After you shampoo, rinse your hair and body thoroughly to remove the shampoo.  4. Use CHG as  you would any other liquid soap. You can apply CHG directly to the skin and wash gently with a scrungie or a clean washcloth.   5. Apply the CHG Soap to your body ONLY FROM THE NECK DOWN.  Do not use on open wounds or open sores. Avoid contact with your eyes, ears, mouth and genitals (private parts). Wash Face and genitals (private parts)  with your normal soap.   6. Wash thoroughly, paying special attention to the area where your surgery will be  performed.  7. Thoroughly rinse your body with warm water from the neck down.  8. DO NOT shower/wash with your normal soap after using and rinsing off the CHG Soap.  9. Pat yourself dry with a CLEAN TOWEL.  10. Wear CLEAN PAJAMAS to bed the night before surgery, wear comfortable clothes the morning of surgery  11. Place CLEAN SHEETS on your bed the night of your first shower and DO NOT SLEEP WITH PETS.  Day of Surgery: Do not apply any deodorants/lotions. Please shower the morning of surgery with the CHG soap  Please wear clean clothes to the hospital/surgery center.   Remember to brush your teeth WITH YOUR REGULAR OOTHPASTE.  Please read over the following fact sheets that you were given.

## 2019-08-20 ENCOUNTER — Encounter (HOSPITAL_COMMUNITY)
Admission: RE | Admit: 2019-08-20 | Discharge: 2019-08-20 | Disposition: A | Discharge status: Home / Self Care | Payer: Medicare Other | Source: Ambulatory Visit | Attending: Neurosurgery | Admitting: Neurosurgery

## 2019-08-20 ENCOUNTER — Other Ambulatory Visit: Payer: Self-pay

## 2019-08-20 ENCOUNTER — Other Ambulatory Visit (HOSPITAL_COMMUNITY)
Admission: RE | Admit: 2019-08-20 | Discharge: 2019-08-20 | Disposition: A | Discharge status: Home / Self Care | Payer: Medicare Other | Source: Ambulatory Visit | Attending: Neurosurgery | Admitting: Neurosurgery

## 2019-08-20 ENCOUNTER — Encounter (HOSPITAL_COMMUNITY): Payer: Self-pay

## 2019-08-20 DIAGNOSIS — Z01812 Encounter for preprocedural laboratory examination: Secondary | ICD-10-CM | POA: Insufficient documentation

## 2019-08-20 DIAGNOSIS — Z20828 Contact with and (suspected) exposure to other viral communicable diseases: Secondary | ICD-10-CM | POA: Insufficient documentation

## 2019-08-20 HISTORY — DX: Other specified postprocedural states: R11.2

## 2019-08-20 HISTORY — DX: Other specified postprocedural states: Z98.890

## 2019-08-20 HISTORY — DX: Anxiety disorder, unspecified: F41.9

## 2019-08-20 LAB — CBC
HCT: 42.4 % (ref 36.0–46.0)
Hemoglobin: 14.4 g/dL (ref 12.0–15.0)
MCH: 30.3 pg (ref 26.0–34.0)
MCHC: 34 g/dL (ref 30.0–36.0)
MCV: 89.1 fL (ref 80.0–100.0)
Platelets: 210 10*3/uL (ref 150–400)
RBC: 4.76 MIL/uL (ref 3.87–5.11)
RDW: 12.8 % (ref 11.5–15.5)
WBC: 6 10*3/uL (ref 4.0–10.5)
nRBC: 0 % (ref 0.0–0.2)

## 2019-08-20 LAB — BASIC METABOLIC PANEL
Anion gap: 9 (ref 5–15)
BUN: 14 mg/dL (ref 8–23)
CO2: 26 mmol/L (ref 22–32)
Calcium: 9.4 mg/dL (ref 8.9–10.3)
Chloride: 106 mmol/L (ref 98–111)
Creatinine, Ser: 0.65 mg/dL (ref 0.44–1.00)
GFR calc Af Amer: >60 mL/min (ref >60)
GFR calc non Af Amer: >60 mL/min (ref >60)
Glucose, Bld: 89 mg/dL (ref 70–99)
Potassium: 4.2 mmol/L (ref 3.5–5.1)
Sodium: 141 mmol/L (ref 135–145)

## 2019-08-20 LAB — SURGICAL PCR SCREEN
MRSA, PCR: NEGATIVE
Staphylococcus aureus: NEGATIVE

## 2019-08-20 LAB — SARS CORONAVIRUS 2 (TAT 6-24 HRS): SARS Coronavirus 2: NEGATIVE

## 2019-08-20 NOTE — Progress Notes (Signed)
Blood bank notified that there was an error in lab collection and the sample was discontinued. Pt will need to have new sample collected on DOS.

## 2019-08-20 NOTE — Progress Notes (Signed)
PCP - Dr. Caren Griffins white Cardiologist - denies  Chest x-ray - N/A EKG - N/A Stress Test -denies  ECHO - denies Cardiac Cath - denies  Sleep Study - N.A CPAP - N/A  Blood Thinner Instructions:N/A Aspirin Instructions:N/A  Anesthesia review: No  Patient denies shortness of breath, fever, cough and chest pain at PAT appointment   Patient verbalized understanding of instructions that were given to them at the PAT appointment. Patient was also instructed that they will need to review over the PAT instructions again at home before surgery.   Coronavirus Screening  Have you experienced the following symptoms:  Cough yes/no: No Fever (>100.22F)  yes/no: No Runny nose yes/no: No Sore throat yes/no: No Difficulty breathing/shortness of breath  yes/no: No  Have you or a family member traveled in the last 14 days and where? yes/no: No   If the patient indicates "YES" to the above questions, their PAT will be rescheduled to limit the exposure to others and, the surgeon will be notified. THE PATIENT WILL NEED TO BE ASYMPTOMATIC FOR 14 DAYS.   If the patient is not experiencing any of these symptoms, the PAT nurse will instruct them to NOT bring anyone with them to their appointment since they may have these symptoms or traveled as well.   Please remind your patients and families that hospital visitation restrictions are in effect and the importance of the restrictions.

## 2019-08-22 ENCOUNTER — Inpatient Hospital Stay (HOSPITAL_COMMUNITY): Payer: Medicare Other | Admitting: Certified Registered Nurse Anesthetist

## 2019-08-22 ENCOUNTER — Inpatient Hospital Stay (HOSPITAL_COMMUNITY): Payer: Medicare Other

## 2019-08-22 ENCOUNTER — Other Ambulatory Visit: Payer: Self-pay

## 2019-08-22 ENCOUNTER — Inpatient Hospital Stay (HOSPITAL_COMMUNITY)
Admission: RE | Disposition: A | Discharge status: Home / Self Care | Payer: Self-pay | Source: Home / Self Care | Attending: Neurosurgery

## 2019-08-22 ENCOUNTER — Inpatient Hospital Stay (HOSPITAL_COMMUNITY)
Admission: RE | Admit: 2019-08-22 | Discharge: 2019-08-24 | DRG: 454 | Disposition: A | Discharge status: Home / Self Care | Payer: Medicare Other | Attending: Neurosurgery | Admitting: Neurosurgery

## 2019-08-22 ENCOUNTER — Encounter (HOSPITAL_COMMUNITY): Payer: Self-pay

## 2019-08-22 DIAGNOSIS — M4157 Other secondary scoliosis, lumbosacral region: Secondary | ICD-10-CM | POA: Diagnosis present

## 2019-08-22 DIAGNOSIS — M48062 Spinal stenosis, lumbar region with neurogenic claudication: Secondary | ICD-10-CM | POA: Diagnosis present

## 2019-08-22 DIAGNOSIS — Z87891 Personal history of nicotine dependence: Secondary | ICD-10-CM

## 2019-08-22 DIAGNOSIS — K219 Gastro-esophageal reflux disease without esophagitis: Secondary | ICD-10-CM | POA: Diagnosis present

## 2019-08-22 DIAGNOSIS — Z20828 Contact with and (suspected) exposure to other viral communicable diseases: Secondary | ICD-10-CM | POA: Diagnosis present

## 2019-08-22 DIAGNOSIS — M5117 Intervertebral disc disorders with radiculopathy, lumbosacral region: Secondary | ICD-10-CM | POA: Diagnosis present

## 2019-08-22 DIAGNOSIS — M4180 Other forms of scoliosis, site unspecified: Secondary | ICD-10-CM | POA: Diagnosis present

## 2019-08-22 DIAGNOSIS — M4150 Other secondary scoliosis, site unspecified: Secondary | ICD-10-CM | POA: Diagnosis present

## 2019-08-22 DIAGNOSIS — G9748 Accidental puncture and laceration of other nervous system organ or structure during a nervous system procedure: Secondary | ICD-10-CM | POA: Diagnosis not present

## 2019-08-22 DIAGNOSIS — Y838 Other surgical procedures as the cause of abnormal reaction of the patient, or of later complication, without mention of misadventure at the time of the procedure: Secondary | ICD-10-CM | POA: Diagnosis not present

## 2019-08-22 DIAGNOSIS — Z419 Encounter for procedure for purposes other than remedying health state, unspecified: Secondary | ICD-10-CM

## 2019-08-22 DIAGNOSIS — F329 Major depressive disorder, single episode, unspecified: Secondary | ICD-10-CM | POA: Diagnosis present

## 2019-08-22 HISTORY — DX: Other secondary scoliosis, site unspecified: M41.50

## 2019-08-22 HISTORY — DX: Other forms of scoliosis, site unspecified: M41.80

## 2019-08-22 LAB — TYPE AND SCREEN
ABO/RH(D): A NEG
Antibody Screen: NEGATIVE

## 2019-08-22 LAB — ABO/RH: ABO/RH(D): A NEG

## 2019-08-22 SURGERY — POSTERIOR LUMBAR FUSION 2 LEVEL
Anesthesia: General | Site: Spine Lumbar

## 2019-08-22 MED ORDER — FENTANYL CITRATE (PF) 250 MCG/5ML IJ SOLN
INTRAMUSCULAR | Status: AC
  Filled 2019-08-22: qty 5

## 2019-08-22 MED ORDER — SUMATRIPTAN SUCCINATE 50 MG PO TABS
50.0000 mg | ORAL_TABLET | ORAL | Status: DC | PRN
  Filled 2019-08-22: qty 1

## 2019-08-22 MED ORDER — MORPHINE SULFATE (PF) 4 MG/ML IV SOLN: 4.0000 mg | INTRAVENOUS | Status: DC | PRN

## 2019-08-22 MED ORDER — ONDANSETRON HCL 4 MG PO TABS: 4.0000 mg | ORAL_TABLET | Freq: Three times a day (TID) | ORAL | Status: DC | PRN

## 2019-08-22 MED ORDER — ONDANSETRON HCL 4 MG/2ML IJ SOLN
INTRAMUSCULAR | Status: AC
  Filled 2019-08-22: qty 2

## 2019-08-22 MED ORDER — MENTHOL 3 MG MT LOZG: 1.0000 | LOZENGE | OROMUCOSAL | Status: DC | PRN

## 2019-08-22 MED ORDER — CEFAZOLIN SODIUM-DEXTROSE 2-4 GM/100ML-% IV SOLN
2.0000 g | INTRAVENOUS | Status: AC
  Administered 2019-08-22: 2 g via INTRAVENOUS

## 2019-08-22 MED ORDER — SODIUM CHLORIDE 0.9 % IV SOLN
INTRAVENOUS | Status: DC | PRN
  Administered 2019-08-22: 500 mL

## 2019-08-22 MED ORDER — SUGAMMADEX SODIUM 200 MG/2ML IV SOLN
INTRAVENOUS | Status: DC | PRN
  Administered 2019-08-22: 150 mg via INTRAVENOUS

## 2019-08-22 MED ORDER — BACITRACIN ZINC 500 UNIT/GM EX OINT
TOPICAL_OINTMENT | CUTANEOUS | Status: AC
  Filled 2019-08-22: qty 28.35

## 2019-08-22 MED ORDER — FENTANYL CITRATE (PF) 100 MCG/2ML IJ SOLN
INTRAMUSCULAR | Status: AC
  Filled 2019-08-22: qty 2

## 2019-08-22 MED ORDER — DEXMEDETOMIDINE HCL IN NACL 200 MCG/50ML IV SOLN
INTRAVENOUS | Status: AC
  Filled 2019-08-22: qty 50

## 2019-08-22 MED ORDER — PHENYLEPHRINE HCL (PRESSORS) 10 MG/ML IV SOLN
INTRAVENOUS | Status: DC | PRN
  Administered 2019-08-22: 80 ug via INTRAVENOUS

## 2019-08-22 MED ORDER — ONDANSETRON HCL 4 MG PO TABS
4.0000 mg | ORAL_TABLET | Freq: Four times a day (QID) | ORAL | Status: DC | PRN
  Administered 2019-08-23: 13:00:00 4 mg via ORAL
  Filled 2019-08-22: qty 1

## 2019-08-22 MED ORDER — THROMBIN 5000 UNITS EX SOLR
CUTANEOUS | Status: AC
  Filled 2019-08-22: qty 5000

## 2019-08-22 MED ORDER — PRAVASTATIN SODIUM 10 MG PO TABS
20.0000 mg | ORAL_TABLET | Freq: Every day | ORAL | Status: DC
  Administered 2019-08-22 – 2019-08-23 (×2): 20 mg via ORAL
  Filled 2019-08-22 (×2): qty 2

## 2019-08-22 MED ORDER — CEFAZOLIN SODIUM-DEXTROSE 2-4 GM/100ML-% IV SOLN
INTRAVENOUS | Status: AC
  Filled 2019-08-22: qty 100

## 2019-08-22 MED ORDER — PREDNISOLONE ACETATE 1 % OP SUSP
1.0000 [drp] | Freq: Two times a day (BID) | OPHTHALMIC | Status: DC
  Administered 2019-08-22 – 2019-08-24 (×3): 1 [drp] via OPHTHALMIC
  Filled 2019-08-22: qty 5

## 2019-08-22 MED ORDER — CHLORHEXIDINE GLUCONATE CLOTH 2 % EX PADS: 6.0000 | MEDICATED_PAD | Freq: Once | CUTANEOUS | Status: DC

## 2019-08-22 MED ORDER — ONDANSETRON HCL 4 MG/2ML IJ SOLN: 4.0000 mg | Freq: Four times a day (QID) | INTRAMUSCULAR | Status: DC | PRN

## 2019-08-22 MED ORDER — BUPIVACAINE-EPINEPHRINE (PF) 0.5% -1:200000 IJ SOLN
INTRAMUSCULAR | Status: AC
  Filled 2019-08-22: qty 30

## 2019-08-22 MED ORDER — MIDAZOLAM HCL 5 MG/5ML IJ SOLN
INTRAMUSCULAR | Status: DC | PRN
  Administered 2019-08-22: 2 mg via INTRAVENOUS

## 2019-08-22 MED ORDER — MIDAZOLAM HCL 2 MG/2ML IJ SOLN
INTRAMUSCULAR | Status: AC
  Filled 2019-08-22: qty 2

## 2019-08-22 MED ORDER — ACETAMINOPHEN 10 MG/ML IV SOLN: 1000.0000 mg | Freq: Once | INTRAVENOUS | Status: DC | PRN

## 2019-08-22 MED ORDER — BUPIVACAINE-EPINEPHRINE (PF) 0.5% -1:200000 IJ SOLN
INTRAMUSCULAR | Status: DC | PRN
  Administered 2019-08-22: 10 mL

## 2019-08-22 MED ORDER — GABAPENTIN 300 MG PO CAPS
300.0000 mg | ORAL_CAPSULE | Freq: Three times a day (TID) | ORAL | Status: DC
  Administered 2019-08-22 – 2019-08-24 (×5): 300 mg via ORAL
  Filled 2019-08-22 (×5): qty 1

## 2019-08-22 MED ORDER — PHENOL 1.4 % MT LIQD: 1.0000 | OROMUCOSAL | Status: DC | PRN

## 2019-08-22 MED ORDER — LIDOCAINE 2% (20 MG/ML) 5 ML SYRINGE
INTRAMUSCULAR | Status: DC | PRN
  Administered 2019-08-22: 80 mg via INTRAVENOUS

## 2019-08-22 MED ORDER — CYCLOBENZAPRINE HCL 10 MG PO TABS: 10.0000 mg | ORAL_TABLET | Freq: Three times a day (TID) | ORAL | Status: DC | PRN

## 2019-08-22 MED ORDER — ACETAMINOPHEN 650 MG RE SUPP: 650.0000 mg | RECTAL | Status: DC | PRN

## 2019-08-22 MED ORDER — FENTANYL CITRATE (PF) 100 MCG/2ML IJ SOLN
INTRAMUSCULAR | Status: DC | PRN
  Administered 2019-08-22 (×5): 50 ug via INTRAVENOUS

## 2019-08-22 MED ORDER — LIDOCAINE 2% (20 MG/ML) 5 ML SYRINGE
INTRAMUSCULAR | Status: AC
  Filled 2019-08-22: qty 15

## 2019-08-22 MED ORDER — ONDANSETRON HCL 4 MG/2ML IJ SOLN
4.0000 mg | Freq: Once | INTRAMUSCULAR | Status: AC | PRN
  Administered 2019-08-22: 19:00:00 4 mg via INTRAVENOUS

## 2019-08-22 MED ORDER — ACETAMINOPHEN 325 MG PO TABS
650.0000 mg | ORAL_TABLET | ORAL | Status: DC | PRN
  Administered 2019-08-24: 03:00:00 650 mg via ORAL
  Filled 2019-08-22: qty 2

## 2019-08-22 MED ORDER — 0.9 % SODIUM CHLORIDE (POUR BTL) OPTIME
TOPICAL | Status: DC | PRN
  Administered 2019-08-22: 1000 mL

## 2019-08-22 MED ORDER — PHENYLEPHRINE 40 MCG/ML (10ML) SYRINGE FOR IV PUSH (FOR BLOOD PRESSURE SUPPORT)
PREFILLED_SYRINGE | INTRAVENOUS | Status: AC
  Filled 2019-08-22: qty 10

## 2019-08-22 MED ORDER — DEXAMETHASONE SODIUM PHOSPHATE 10 MG/ML IJ SOLN
INTRAMUSCULAR | Status: DC | PRN
  Administered 2019-08-22: 10 mg via INTRAVENOUS

## 2019-08-22 MED ORDER — DICYCLOMINE HCL 10 MG PO CAPS: 10.0000 mg | ORAL_CAPSULE | Freq: Three times a day (TID) | ORAL | Status: DC | PRN

## 2019-08-22 MED ORDER — PROPOFOL 10 MG/ML IV BOLUS
INTRAVENOUS | Status: AC
  Filled 2019-08-22: qty 20

## 2019-08-22 MED ORDER — THROMBIN 5000 UNITS EX SOLR
OROMUCOSAL | Status: DC | PRN
  Administered 2019-08-22 (×2): 5 mL

## 2019-08-22 MED ORDER — BACITRACIN ZINC 500 UNIT/GM EX OINT
TOPICAL_OINTMENT | CUTANEOUS | Status: DC | PRN
  Administered 2019-08-22: 1 via TOPICAL

## 2019-08-22 MED ORDER — DOCUSATE SODIUM 100 MG PO CAPS
100.0000 mg | ORAL_CAPSULE | Freq: Two times a day (BID) | ORAL | Status: DC
  Administered 2019-08-22 – 2019-08-24 (×4): 100 mg via ORAL
  Filled 2019-08-22 (×4): qty 1

## 2019-08-22 MED ORDER — ACETAMINOPHEN 10 MG/ML IV SOLN
INTRAVENOUS | Status: DC | PRN
  Administered 2019-08-22: 1000 mg via INTRAVENOUS

## 2019-08-22 MED ORDER — OXYCODONE HCL 5 MG PO TABS
10.0000 mg | ORAL_TABLET | ORAL | Status: DC | PRN
  Administered 2019-08-22: 21:00:00 10 mg via ORAL
  Filled 2019-08-22: qty 2

## 2019-08-22 MED ORDER — FAMOTIDINE 20 MG PO TABS: 40.0000 mg | ORAL_TABLET | Freq: Every day | ORAL | Status: DC | PRN

## 2019-08-22 MED ORDER — ALPRAZOLAM 0.25 MG PO TABS: 0.2500 mg | ORAL_TABLET | Freq: Two times a day (BID) | ORAL | Status: DC | PRN

## 2019-08-22 MED ORDER — EPHEDRINE SULFATE 50 MG/ML IJ SOLN
INTRAMUSCULAR | Status: DC | PRN
  Administered 2019-08-22 (×2): 10 mg via INTRAVENOUS

## 2019-08-22 MED ORDER — CEFAZOLIN SODIUM-DEXTROSE 2-4 GM/100ML-% IV SOLN
2.0000 g | Freq: Three times a day (TID) | INTRAVENOUS | Status: AC
  Administered 2019-08-22 – 2019-08-23 (×2): 2 g via INTRAVENOUS
  Filled 2019-08-22 (×2): qty 100

## 2019-08-22 MED ORDER — ROCURONIUM BROMIDE 10 MG/ML (PF) SYRINGE
PREFILLED_SYRINGE | INTRAVENOUS | Status: AC
  Filled 2019-08-22: qty 30

## 2019-08-22 MED ORDER — ACETAMINOPHEN 500 MG PO TABS
1000.0000 mg | ORAL_TABLET | Freq: Four times a day (QID) | ORAL | Status: AC
  Administered 2019-08-22 – 2019-08-23 (×4): 1000 mg via ORAL
  Filled 2019-08-22 (×4): qty 2

## 2019-08-22 MED ORDER — BUPIVACAINE LIPOSOME 1.3 % IJ SUSP
20.0000 mL | INTRAMUSCULAR | Status: AC
  Administered 2019-08-22: 20 mL
  Filled 2019-08-22: qty 20

## 2019-08-22 MED ORDER — ACETAMINOPHEN 10 MG/ML IV SOLN
INTRAVENOUS | Status: AC
  Filled 2019-08-22: qty 100

## 2019-08-22 MED ORDER — ROCURONIUM BROMIDE 50 MG/5ML IV SOSY
PREFILLED_SYRINGE | INTRAVENOUS | Status: DC | PRN
  Administered 2019-08-22 (×2): 25 mg via INTRAVENOUS
  Administered 2019-08-22: 50 mg via INTRAVENOUS

## 2019-08-22 MED ORDER — ZOLPIDEM TARTRATE 5 MG PO TABS: 5.0000 mg | ORAL_TABLET | Freq: Every evening | ORAL | Status: DC | PRN

## 2019-08-22 MED ORDER — ALBUMIN HUMAN 5 % IV SOLN
INTRAVENOUS | Status: DC | PRN
  Administered 2019-08-22: 17:00:00 via INTRAVENOUS

## 2019-08-22 MED ORDER — THROMBIN 20000 UNITS EX SOLR
CUTANEOUS | Status: AC
  Filled 2019-08-22: qty 20000

## 2019-08-22 MED ORDER — THROMBIN 20000 UNITS EX SOLR
CUTANEOUS | Status: DC | PRN
  Administered 2019-08-22: 20 mL

## 2019-08-22 MED ORDER — OXYCODONE HCL 5 MG PO TABS
5.0000 mg | ORAL_TABLET | ORAL | Status: DC | PRN
  Administered 2019-08-23 – 2019-08-24 (×4): 5 mg via ORAL
  Filled 2019-08-22 (×5): qty 1

## 2019-08-22 MED ORDER — BISACODYL 10 MG RE SUPP: 10.0000 mg | Freq: Every day | RECTAL | Status: DC | PRN

## 2019-08-22 MED ORDER — SODIUM CHLORIDE 0.9% FLUSH: 3.0000 mL | INTRAVENOUS | Status: DC | PRN

## 2019-08-22 MED ORDER — PROPOFOL 10 MG/ML IV BOLUS
INTRAVENOUS | Status: DC | PRN
  Administered 2019-08-22: 100 mg via INTRAVENOUS

## 2019-08-22 MED ORDER — PROPRANOLOL HCL 10 MG PO TABS
10.0000 mg | ORAL_TABLET | Freq: Two times a day (BID) | ORAL | Status: DC
  Administered 2019-08-22 – 2019-08-23 (×2): 10 mg via ORAL
  Filled 2019-08-22 (×5): qty 1

## 2019-08-22 MED ORDER — SODIUM CHLORIDE 0.9 % IV SOLN
INTRAVENOUS | Status: DC | PRN
  Administered 2019-08-22: 13:00:00 25 ug/min via INTRAVENOUS

## 2019-08-22 MED ORDER — FENTANYL CITRATE (PF) 100 MCG/2ML IJ SOLN
25.0000 ug | INTRAMUSCULAR | Status: DC | PRN
  Administered 2019-08-22: 19:00:00 25 ug via INTRAVENOUS

## 2019-08-22 MED ORDER — LACTATED RINGERS IV SOLN
INTRAVENOUS | Status: DC
  Administered 2019-08-22 (×2): via INTRAVENOUS

## 2019-08-22 MED ORDER — ONDANSETRON HCL 4 MG/2ML IJ SOLN
INTRAMUSCULAR | Status: DC | PRN
  Administered 2019-08-22: 4 mg via INTRAVENOUS

## 2019-08-22 MED ORDER — SODIUM CHLORIDE 0.9% FLUSH: 3.0000 mL | Freq: Two times a day (BID) | INTRAVENOUS | Status: DC

## 2019-08-22 SURGICAL SUPPLY — 74 items
ADH SKN CLS APL DERMABOND .7 (GAUZE/BANDAGES/DRESSINGS) ×1
APL SKNCLS STERI-STRIP NONHPOA (GAUZE/BANDAGES/DRESSINGS) ×1
APL SRG 60D 8 XTD TIP BNDBL (TIP) ×2
BAG DECANTER FOR FLEXI CONT (MISCELLANEOUS) ×2 IMPLANT
BASKET BONE COLLECTION (BASKET) ×1 IMPLANT
BENZOIN TINCTURE PRP APPL 2/3 (GAUZE/BANDAGES/DRESSINGS) ×2 IMPLANT
BLADE CLIPPER SURG (BLADE) IMPLANT
BUR MATCHSTICK NEURO 3.0 LAGG (BURR) ×2 IMPLANT
BUR PRECISION FLUTE 6.0 (BURR) ×2 IMPLANT
CANISTER SUCT 3000ML PPV (MISCELLANEOUS) ×2 IMPLANT
CAP REVERE LOCKING (Cap) ×6 IMPLANT
CARTRIDGE OIL MAESTRO DRILL (MISCELLANEOUS) ×1 IMPLANT
CONT SPEC 4OZ CLIKSEAL STRL BL (MISCELLANEOUS) ×2 IMPLANT
COVER BACK TABLE 60X90IN (DRAPES) ×2 IMPLANT
DECANTER SPIKE VIAL GLASS SM (MISCELLANEOUS) ×2 IMPLANT
DERMABOND ADVANCED (GAUZE/BANDAGES/DRESSINGS) ×1
DERMABOND ADVANCED .7 DNX12 (GAUZE/BANDAGES/DRESSINGS) IMPLANT
DIFFUSER DRILL AIR PNEUMATIC (MISCELLANEOUS) ×2 IMPLANT
DRAPE C-ARM 42X72 X-RAY (DRAPES) ×4 IMPLANT
DRAPE HALF SHEET 40X57 (DRAPES) ×3 IMPLANT
DRAPE LAPAROTOMY 100X72X124 (DRAPES) ×2 IMPLANT
DRAPE SURG 17X23 STRL (DRAPES) ×8 IMPLANT
DRSG OPSITE POSTOP 4X6 (GAUZE/BANDAGES/DRESSINGS) ×1 IMPLANT
DRSG OPSITE POSTOP 4X8 (GAUZE/BANDAGES/DRESSINGS) ×1 IMPLANT
DURASEAL APPLICATOR TIP (TIP) ×2 IMPLANT
DURASEAL SPINE SEALANT 3ML (MISCELLANEOUS) ×2 IMPLANT
ELECT BLADE 4.0 EZ CLEAN MEGAD (MISCELLANEOUS) ×2
ELECT REM PT RETURN 9FT ADLT (ELECTROSURGICAL) ×2
ELECTRODE BLDE 4.0 EZ CLN MEGD (MISCELLANEOUS) ×1 IMPLANT
ELECTRODE REM PT RTRN 9FT ADLT (ELECTROSURGICAL) ×1 IMPLANT
GAUZE 4X4 16PLY RFD (DISPOSABLE) ×2 IMPLANT
GAUZE SPONGE 4X4 12PLY STRL (GAUZE/BANDAGES/DRESSINGS) ×2 IMPLANT
GLOVE BIO SURGEON STRL SZ8 (GLOVE) ×4 IMPLANT
GLOVE BIO SURGEON STRL SZ8.5 (GLOVE) ×4 IMPLANT
GLOVE EXAM NITRILE XL STR (GLOVE) IMPLANT
GOWN STRL REUS W/ TWL LRG LVL3 (GOWN DISPOSABLE) IMPLANT
GOWN STRL REUS W/ TWL XL LVL3 (GOWN DISPOSABLE) ×2 IMPLANT
GOWN STRL REUS W/TWL 2XL LVL3 (GOWN DISPOSABLE) IMPLANT
GOWN STRL REUS W/TWL LRG LVL3 (GOWN DISPOSABLE) ×8
GOWN STRL REUS W/TWL XL LVL3 (GOWN DISPOSABLE) ×4
HEMOSTAT POWDER KIT SURGIFOAM (HEMOSTASIS) ×3 IMPLANT
KIT BASIN OR (CUSTOM PROCEDURE TRAY) ×2 IMPLANT
KIT TURNOVER KIT B (KITS) ×2 IMPLANT
MARKER SKIN DUAL TIP RULER LAB (MISCELLANEOUS) ×1 IMPLANT
NDL HYPO 21X1.5 SAFETY (NEEDLE) IMPLANT
NEEDLE HYPO 21X1.5 SAFETY (NEEDLE) IMPLANT
NEEDLE HYPO 22GX1.5 SAFETY (NEEDLE) ×2 IMPLANT
NS IRRIG 1000ML POUR BTL (IV SOLUTION) ×2 IMPLANT
OIL CARTRIDGE MAESTRO DRILL (MISCELLANEOUS) ×2
PACK LAMINECTOMY NEURO (CUSTOM PROCEDURE TRAY) ×2 IMPLANT
PAD ARMBOARD 7.5X6 YLW CONV (MISCELLANEOUS) ×6 IMPLANT
PATTIES SURGICAL .5 X.5 (GAUZE/BANDAGES/DRESSINGS) ×1 IMPLANT
PATTIES SURGICAL .5 X1 (DISPOSABLE) IMPLANT
PATTIES SURGICAL 1X1 (DISPOSABLE) ×1 IMPLANT
PUTTY DBM 10CC CALC GRAN (Putty) ×1 IMPLANT
PUTTY DBM 5CC CALC GRAN ×1 IMPLANT
ROD REVERE CURVED 65MM (Rod) ×2 IMPLANT
SCREW 7.5X45MM (Screw) ×4 IMPLANT
SCREW REVERE 6.35 7.5X35 (Screw) ×1 IMPLANT
SCREW REVERE 6.35 7.5X40 (Screw) ×1 IMPLANT
SPACER ALTERA 10X31 8-12MM-8 (Spacer) ×2 IMPLANT
SPONGE LAP 4X18 RFD (DISPOSABLE) IMPLANT
SPONGE NEURO XRAY DETECT 1X3 (DISPOSABLE) IMPLANT
SPONGE SURGIFOAM ABS GEL 100 (HEMOSTASIS) ×1 IMPLANT
STRIP CLOSURE SKIN 1/2X4 (GAUZE/BANDAGES/DRESSINGS) ×2 IMPLANT
SUT PROLENE 6 0 BV (SUTURE) ×5 IMPLANT
SUT VIC AB 1 CT1 18XBRD ANBCTR (SUTURE) ×2 IMPLANT
SUT VIC AB 1 CT1 8-18 (SUTURE) ×4
SUT VIC AB 2-0 CP2 18 (SUTURE) ×4 IMPLANT
SYR 20ML LL LF (SYRINGE) ×1 IMPLANT
TOWEL GREEN STERILE (TOWEL DISPOSABLE) ×2 IMPLANT
TOWEL GREEN STERILE FF (TOWEL DISPOSABLE) ×2 IMPLANT
TRAY FOLEY MTR SLVR 16FR STAT (SET/KITS/TRAYS/PACK) ×2 IMPLANT
WATER STERILE IRR 1000ML POUR (IV SOLUTION) ×2 IMPLANT

## 2019-08-22 NOTE — Op Note (Signed)
Brief history: The patient is a 68 year old white female who has complained of back and right leg pain consistent with neurogenic claudication.  She has failed medical management and was worked up with a lumbar MRI and myelo CT.  This demonstrated an adult degenerative scoliosis with foraminal stenosis most significant at L4-5 and L5-S1.  I discussed the various treatment options with her.  She has weighed the risks, benefits and alternatives surgery and decided proceed with an L4-5 and L5-S1 decompression, instrumentation and fusion.  Preoperative diagnosis: Adult degenerative scoliosis, L4-5 and L5-S1 degenerative disc disease, spinal stenosis compressing both the L4 and the L5 nerve roots; lumbago; lumbar radiculopathy; neurogenic claudication  Postoperative diagnosis: The same  Procedure: Bilateral L4-5 and L5-S1 laminotomy/foraminotomies/medial facetectomy to decompress the bilateral L4, L5 and S1 nerve roots(the work required to do this was in addition to the work required to do the posterior lumbar interbody fusion because of the patient's femoral stenosis stenosis, far lateral herniated disc, facet arthropathy. Etc. requiring a wide decompression of the nerve roots.);  L4-5 and L5-S1 transforaminal lumbar interbody fusion with local morselized autograft bone and Zimmer DBM; insertion of interbody prosthesis at 4 5 and L5-S1 (globus peek expandable interbody prosthesis); posterior segmental instrumentation from L4 to S1 with globus titanium pedicle screws and rods; posterior lateral arthrodesis at L4-5 and L5-S1 with local morselized autograft bone and Zimmer DBM.  Surgeon: Dr. Earle Gell  Asst.: Elmer Ramp nurse practitioner  Anesthesia: Gen. endotracheal  Estimated blood loss: 400 cc  Drains: None  Complications: Durotomy  Description of procedure: The patient was brought to the operating room by the anesthesia team. General endotracheal anesthesia was induced. The patient was turned  to the prone position on the Wilson frame. The patient's lumbosacral region was then prepared with Betadine scrub and Betadine solution. Sterile drapes were applied.  I then injected the area to be incised with Marcaine with epinephrine solution. I then used the scalpel to make a linear midline incision over the L4-5 and L5-S1 interspace. I then used electrocautery to perform a bilateral subperiosteal dissection exposing the spinous process and lamina of L4, L5 and S1. We then obtained intraoperative radiograph to confirm our location. We then inserted the Verstrac retractor to provide exposure.  I began the decompression by using the high speed drill to perform laminotomies at L4-5 and L5-S1 bilaterally. We then used the Kerrison punches to widen the laminotomy and removed the ligamentum flavum at L4-5 and L5-S1 bilaterally. We used the Kerrison punches to remove the medial facets at L4-5 and L5-S1 bilaterally. We performed wide foraminotomies about the bilateral L4, L5 and S1 nerve roots completing the decompression.  We now turned our attention to the posterior lumbar interbody fusion. I used a scalpel to incise the intervertebral disc at L4-5 and L5-S1 bilaterally. I then performed a partial intervertebral discectomy at L4-5 and L5-S1 bilaterally using the pituitary forceps.  We encountered a large far lateral herniated disks at L5-S1 on the right compressing the exiting L5 nerve root.  We removed it with the pituitary forceps decompressing the L5 nerve root.  We prepared the vertebral endplates at 075-GRM and 075-GRM for the fusion by removing the soft tissues with the curettes. We then used the trial spacers to pick the appropriate sized interbody prosthesis. We prefilled his prosthesis with a combination of local morselized autograft bone that we obtained during the decompression as well as Zimmer DBM. We inserted the prefilled prosthesis into the interspace at L4-5 and L5-S1 from  the right, we then turned  and expanded the prosthesis. There was a good snug fit of the prosthesis in the interspace. We then filled and the remainder of the intervertebral disc space with local morselized autograft bone and Zimmer DBM. This completed the posterior lumbar interbody arthrodesis.  In inserting the prosthesis at L4-5 on the right we created a durotomy just distal to the takeoff of the L4 nerve root.  Repaired this with interrupted and running 6-0 Prolene sutures.  We then covered the durotomy closure with tissue glue.  We now turned attention to the instrumentation. Under fluoroscopic guidance we cannulated the bilateral L4, L5 and S1 pedicles with the bone probe. We then removed the bone probe. We then tapped the pedicle with a 6.5 millimeter tap. We then removed the tap. We probed inside the tapped pedicle with a ball probe to rule out cortical breaches. We then inserted a 7.5 x 30, 40 and 45 millimeter pedicle screw into the L4, L5 and S1 pedicles bilaterally under fluoroscopic guidance. We then palpated along the medial aspect of the pedicles to rule out cortical breaches. There were none. The nerve roots were not injured. We then connected the unilateral pedicle screws with a lordotic rod. We compressed the construct and secured the rod in place with the caps. We then tightened the caps appropriately. This completed the instrumentation from L4-S1 bilaterally.  We now turned our attention to the posterior lateral arthrodesis at L4-5 and L5-S1 bilaterally. We used the high-speed drill to decorticate the remainder of the facets, pars, transverse process at L4-5 and L5-S1 bilaterally. We then applied a combination of local morselized autograft bone and Zimmer DBM over these decorticated posterior lateral structures. This completed the posterior lateral arthrodesis.  We then obtained hemostasis using bipolar electrocautery. We irrigated the wound out with bacitracin solution. We inspected the thecal sac and nerve roots  and noted they were well decompressed. We then removed the retractor.  We reapproximated patient's thoracolumbar fascia with interrupted #1 Vicryl suture. We reapproximated patient's subcutaneous tissue with interrupted 2-0 Vicryl suture. The reapproximated patient's skin with Steri-Strips and benzoin. The wound was then coated with bacitracin ointment. A sterile dressing was applied. The drapes were removed. The patient was subsequently returned to the supine position where they were extubated by the anesthesia team. He was then transported to the post anesthesia care unit in stable condition. All sponge instrument and needle counts were reportedly correct at the end of this case.

## 2019-08-22 NOTE — H&P (Signed)
Subjective: The patient is a 68 year old white female who has complained of back and leg pain consistent with a lumbar radiculopathy.  She has failed medical management and was worked up with a lumbar myelo CT, lumbar x-rays and a lumbar MRI.  This demonstrated an adult degenerative scoliosis with foraminal stenosis most significant at L4-5 and L5-S1.  I discussed the various treatment options with the patient.  She has weighed the risks, benefits and alternative surgery and decided proceed with a L4-5 and L5-S1 decompression, instrumentation and fusion.  Past Medical History:  Diagnosis Date  . Anxiety   . Arthritis   . Bunion   . DDD (degenerative disc disease), cervical   . Depression   . Endometrial polyp   . Fibrocystic breast changes   . Fundic gland polyps of stomach, benign 08/2016  . GERD (gastroesophageal reflux disease)   . Headache, migraine   . Hirsutism   . Hyperlipidemia   . IBS (irritable bowel syndrome)   . Iron deficiency anemia   . Osteoarthritis   . Osteoporosis   . PONV (postoperative nausea and vomiting)   . Tinnitus     Past Surgical History:  Procedure Laterality Date  . COLONOSCOPY    . DILATION AND CURETTAGE OF UTERUS  1995   x 2  . EUS    . HYSTEROSCOPY    . TUBAL LIGATION  1989  . UPPER GASTROINTESTINAL ENDOSCOPY      Allergies  Allergen Reactions  . Codeine Nausea And Vomiting  . Erythromycin Nausea Only  . Sumatriptan Other (See Comments)    Not effective  . Topamax [Topiramate] Other (See Comments)    dizzy  . Tramadol Nausea And Vomiting    Headaches, dizziness  . Hydrocodone Nausea Only    dizziness    Social History   Tobacco Use  . Smoking status: Former Smoker    Quit date: 12/27/1966    Years since quitting: 52.6  . Smokeless tobacco: Never Used  Substance Use Topics  . Alcohol use: No    Family History  Problem Relation Age of Onset  . Hypertension Mother   . CVA Mother   . COPD Mother   . Pulmonary fibrosis Mother   .  Hypertension Father   . GER disease Sister   . Hyperlipidemia Sister   . CVA Maternal Grandmother   . Hypertension Maternal Grandmother   . Arthritis Maternal Grandmother   . CVA Maternal Grandfather   . Hypertension Maternal Grandfather   . Colon cancer Neg Hx    Prior to Admission medications   Medication Sig Start Date End Date Taking? Authorizing Provider  ALPRAZolam (XANAX) 0.25 MG tablet Take 0.25 mg by mouth 2 (two) times daily as needed for anxiety.   Yes [provider]  b complex vitamins tablet Take 1 tablet by mouth every other day.    Yes [provider]  Biotin 1000 MCG tablet Take 1,000 mcg by mouth daily.   Yes [provider]  dicyclomine (BENTYL) 10 MG capsule Take 10 mg by mouth 3 (three) times daily as needed for spasms.    Yes [provider]  famotidine (PEPCID) 40 MG tablet Take 40 mg by mouth daily as needed for heartburn.    Yes [provider]  gabapentin (NEURONTIN) 300 MG capsule Take 300 mg by mouth 3 (three) times daily.   Yes [provider]  Multiple Vitamin (MULTIVITAMIN) tablet Take 1 tablet by mouth daily.   Yes [provider]  naproxen (NAPROSYN) 500 MG tablet Take 500 mg by mouth 2 (two) times daily as needed for moderate pain.   Yes [provider]  ondansetron (ZOFRAN) 4 MG tablet Take 4 mg by mouth every 8 (eight) hours as needed for nausea or vomiting.  07/04/16  Yes [provider]  phenylephrine-shark liver oil-mineral oil-petrolatum (PREPARATION H) 0.25-3-14-71.9 % rectal ointment Place 1 application rectally 2 (two) times daily as needed for hemorrhoids.   Yes [provider]  Polyethylene Glycol 400 (BLINK TEARS) 0.25 % SOLN Place 1 drop into both eyes 2 (two) times daily as needed (dry eyes).   Yes [provider]  pravastatin (PRAVACHOL) 20 MG tablet Take 20 mg by mouth daily.   Yes [provider]  prednisoLONE acetate (PRED FORTE) 1 %  ophthalmic suspension Place 1 drop into the right eye 2 (two) times daily.   Yes [provider]  propranolol (INDERAL) 10 MG tablet Take 10 mg by mouth 2 (two) times daily.    Yes [provider]  rizatriptan (MAXALT) 10 MG tablet Take 10 mg by mouth as needed for migraine. May repeat in 2 hours if needed   Yes [provider]  cyclobenzaprine (FLEXERIL) 5 MG tablet Take 5 mg by mouth 3 (three) times daily as needed for muscle spasms.     [provider]  denosumab (PROLIA) 60 MG/ML SOLN injection Inject 60 mg into the skin every 6 (six) months. Administer in upper arm, thigh, or abdomen    [provider]  diclofenac sodium (VOLTAREN) 1 % GEL Apply 1 application topically 3 (three) times daily as needed (pain).     [provider]     Review of Systems  Positive ROS: As above  All other systems have been reviewed and were otherwise negative with the exception of those mentioned in the HPI and as above.  Objective: Vital signs in last 24 hours: Temp:  [98.6 F (37 C)] 98.6 F (37 C) (08/26 0948) Pulse Rate:  [67] 67 (08/26 0948) Resp:  [18] 18 (08/26 0948) BP: (123)/(68) 123/68 (08/26 0948) SpO2:  [96 %] 96 % (08/26 0948) Weight:  [51 kg] 51 kg (08/26 1014) Estimated body mass index is 21.06 kg/m as calculated from the following:   Height as of this encounter: 5' 1.25" (1.556 m).   Weight as of this encounter: 51 kg.   General Appearance: Alert Head: Normocephalic, without obvious abnormality, atraumatic Eyes: PERRL, conjunctiva/corneas clear, EOM's intact,    Ears: Normal  Throat: Normal  Neck: Supple, Back: unremarkable Lungs: Clear to auscultation bilaterally, respirations unlabored Heart: Regular rate and rhythm, no murmur, rub or gallop Abdomen: Soft, non-tender Extremities: Extremities normal, atraumatic, no cyanosis or edema Skin: unremarkable  NEUROLOGIC:   Mental status: alert and oriented,Motor Exam -  grossly normal Sensory Exam - grossly normal Reflexes:  Coordination - grossly normal Gait - grossly normal Balance - grossly normal Cranial Nerves: I: smell Not tested  II: visual acuity  OS: Normal  OD: Normal   II: visual fields Full to confrontation  II: pupils Equal, round, reactive to light  III,VII: ptosis None  III,IV,VI: extraocular muscles  Full ROM  V: mastication Normal  V: facial light touch sensation  Normal  V,VII: corneal reflex  Present  VII: facial muscle function - upper  Normal  VII: facial muscle function - lower Normal  VIII: hearing Not tested  IX: soft palate elevation  Normal  IX,X: gag reflex Present  XI: trapezius  strength  5/5  XI: sternocleidomastoid strength 5/5  XI: neck flexion strength  5/5  XII: tongue strength  Normal    Data Review Lab Results  Component Value Date   WBC 6.0 08/20/2019   HGB 14.4 08/20/2019   HCT 42.4 08/20/2019   MCV 89.1 08/20/2019   PLT 210 08/20/2019   Lab Results  Component Value Date   NA 141 08/20/2019   K 4.2 08/20/2019   CL 106 08/20/2019   CO2 26 08/20/2019   BUN 14 08/20/2019   CREATININE 0.65 08/20/2019   GLUCOSE 89 08/20/2019   No results found for: INR, PROTIME  Assessment/Plan: L4-5 and L5-S1 foraminal stenosis, adult degenerative scoliosis, lumbago, lumbar radiculopathy, neurogenic claudication: I have discussed the situation with the patient.  I have reviewed her imaging studies with her and pointed out the abnormalities.  We have discussed the various treatment options including surgery.  I have described the surgical treatment option of an L4-5 and L5-S1 decompression, instrumentation and fusion.  I have shown her surgical models.  I have given her a surgical pamphlet.  We have discussed the risks, benefits, alternatives, expected postoperative course, and likelihood of achieving her goals with surgery.  I have answered all her questions.  She has decided to proceed with surgery.   Ophelia Charter 08/22/2019 11:52 AM

## 2019-08-22 NOTE — Progress Notes (Signed)
Subjective: The patient is somnolent but arousable.  She is in no apparent distress.  She looks well.  Objective: Vital signs in last 24 hours: Temp:  [98.6 F (37 C)-98.7 F (37.1 C)] 98.7 F (37.1 C) (08/26 1730) Pulse Rate:  [67-98] 98 (08/26 1737) Resp:  [18-19] 19 (08/26 1737) BP: (112-123)/(56-68) 112/56 (08/26 1730) SpO2:  [96 %-100 %] 96 % (08/26 1737) Weight:  [51 kg] 51 kg (08/26 1014) Estimated body mass index is 21.06 kg/m as calculated from the following:   Height as of this encounter: 5' 1.25" (1.556 m).   Weight as of this encounter: 51 kg.   Intake/Output from previous day: No intake/output data recorded. Intake/Output this shift: Total I/O In: 1850 [I.V.:1600; IV Piggyback:250] Out: 975 [Urine:775; Blood:200]  Physical exam the patient is somnolent but arousable.  She is moving her lower extremities well.  Lab Results: Recent Labs    08/20/19 0935  WBC 6.0  HGB 14.4  HCT 42.4  PLT 210   BMET Recent Labs    08/20/19 0935  NA 141  K 4.2  CL 106  CO2 26  GLUCOSE 89  BUN 14  CREATININE 0.65  CALCIUM 9.4    Studies/Results: Dg Lumbar Spine 1 View  Result Date: 08/22/2019 CLINICAL DATA:  Lumbar fusion EXAM: LUMBAR SPINE - 1 VIEW COMPARISON:  CT myelogram 07/11/2019 FINDINGS: Single lateral intraoperative radiograph demonstrates posterior surgical instruments, tip of probe projecting just inferior to the L5 spinous process. IMPRESSION: Intraoperative localization. Electronically Signed   By: Lucrezia Europe M.D.   On: 08/22/2019 16:53    Assessment/Plan: The patient is doing well.  I spoke with her husband.  We will keep her in bed, with the head of bed less than 30 degrees, until tomorrow morning.  LOS: 0 days     Ophelia Charter 08/22/2019, 5:46 PM

## 2019-08-22 NOTE — Anesthesia Preprocedure Evaluation (Addendum)
Anesthesia Evaluation  Patient identified by MRN, date of birth, ID band Patient awake    Reviewed: Allergy & Precautions, NPO status , Patient's Chart, lab work & pertinent test results  History of Anesthesia Complications (+) PONV  Airway Mallampati: I       Dental no notable dental hx. (+) Teeth Intact   Pulmonary former smoker,    Pulmonary exam normal breath sounds clear to auscultation       Cardiovascular Exercise Tolerance: Good negative cardio ROS Normal cardiovascular exam Rhythm:Regular Rate:Normal     Neuro/Psych  Headaches, Depression    GI/Hepatic Neg liver ROS, GERD  ,  Endo/Other  negative endocrine ROS  Renal/GU negative Renal ROS     Musculoskeletal   Abdominal Normal abdominal exam  (+)   Peds  Hematology  (+) anemia ,   Anesthesia Other Findings   Reproductive/Obstetrics                            Anesthesia Physical Anesthesia Plan  ASA: II  Anesthesia Plan: General   Post-op Pain Management:    Induction: Intravenous  PONV Risk Score and Plan: 4 or greater and Treatment may vary due to age or medical condition, Ondansetron and Dexamethasone  Airway Management Planned: Oral ETT  Additional Equipment:   Intra-op Plan:   Post-operative Plan: Extubation in OR  Informed Consent:     Dental advisory given  Plan Discussed with:   Anesthesia Plan Comments:         Anesthesia Quick Evaluation

## 2019-08-22 NOTE — Anesthesia Procedure Notes (Signed)
Procedure Name: Intubation Date/Time: 08/22/2019 12:48 PM Performed by: Inda Coke, CRNA Pre-anesthesia Checklist: Patient identified, Emergency Drugs available, Suction available and Patient being monitored Patient Re-evaluated:Patient Re-evaluated prior to induction Oxygen Delivery Method: Circle System Utilized Preoxygenation: Pre-oxygenation with 100% oxygen Induction Type: IV induction Ventilation: Mask ventilation without difficulty Laryngoscope Size: Mac and 3 Grade View: Grade I Tube type: Oral Tube size: 7.0 mm Number of attempts: 1 Airway Equipment and Method: Stylet and Oral airway Placement Confirmation: ETT inserted through vocal cords under direct vision,  positive ETCO2 and breath sounds checked- equal and bilateral Secured at: 21 cm Tube secured with: Tape Dental Injury: Teeth and Oropharynx as per pre-operative assessment

## 2019-08-22 NOTE — Transfer of Care (Signed)
Immediate Anesthesia Transfer of Care Note  Patient: Audrey Powers  Procedure(s) Performed: POSTERIOR LUMBAR INTERBODY FUSION WITH INSTRUMENTATION LUMBAR FOUR- LUMBAR FIVE, LUMBAR FIVE- SACRAL ONE (N/A Spine Lumbar)  Patient Location: PACU  Anesthesia Type:General  Level of Consciousness: awake, alert  and drowsy  Airway & Oxygen Therapy: Patient Spontanous Breathing and Patient connected to nasal cannula oxygen  Post-op Assessment: Report given to RN and Post -op Vital signs reviewed and stable  Post vital signs: Reviewed and stable  Last Vitals:  Vitals Value Taken Time  BP 112/56 08/22/19 1730  Temp 37.1 C 08/22/19 1730  Pulse 88 08/22/19 1734  Resp 18 08/22/19 1734  SpO2 99 % 08/22/19 1734  Vitals shown include unvalidated device data.  Last Pain:  Vitals:   08/22/19 1730  TempSrc:   PainSc: (P) Asleep      Patients Stated Pain Goal: 3 ([id number])  Complications: No apparent anesthesia complications

## 2019-08-23 ENCOUNTER — Encounter (HOSPITAL_COMMUNITY): Payer: Self-pay | Admitting: Neurosurgery

## 2019-08-23 LAB — CBC
HCT: 31.6 % — ABNORMAL LOW (ref 36.0–46.0)
Hemoglobin: 10.9 g/dL — ABNORMAL LOW (ref 12.0–15.0)
MCH: 30.9 pg (ref 26.0–34.0)
MCHC: 34.5 g/dL (ref 30.0–36.0)
MCV: 89.5 fL (ref 80.0–100.0)
Platelets: 166 10*3/uL (ref 150–400)
RBC: 3.53 MIL/uL — ABNORMAL LOW (ref 3.87–5.11)
RDW: 12.5 % (ref 11.5–15.5)
WBC: 14.3 10*3/uL — ABNORMAL HIGH (ref 4.0–10.5)
nRBC: 0 % (ref 0.0–0.2)

## 2019-08-23 LAB — BASIC METABOLIC PANEL
Anion gap: 9 (ref 5–15)
BUN: 13 mg/dL (ref 8–23)
CO2: 23 mmol/L (ref 22–32)
Calcium: 8.3 mg/dL — ABNORMAL LOW (ref 8.9–10.3)
Chloride: 107 mmol/L (ref 98–111)
Creatinine, Ser: 0.8 mg/dL (ref 0.44–1.00)
GFR calc Af Amer: >60 mL/min (ref >60)
GFR calc non Af Amer: >60 mL/min (ref >60)
Glucose, Bld: 170 mg/dL — ABNORMAL HIGH (ref 70–99)
Potassium: 3.9 mmol/L (ref 3.5–5.1)
Sodium: 139 mmol/L (ref 135–145)

## 2019-08-23 MED FILL — Thrombin For Soln 5000 Unit: CUTANEOUS | Qty: 5000 | Status: AC

## 2019-08-23 NOTE — Progress Notes (Signed)
Orthopedic Tech Progress Note Patient Details:  Audrey Powers [birth year] [id number] RN said patient has brace  Patient ID: Audrey Powers, female   DOB: Dec 08, 1951, 68 y.o.   MRN: [id number]   [name] 08/23/2019, 8:00 AM

## 2019-08-23 NOTE — Evaluation (Signed)
Physical Therapy Evaluation Patient Details Name: Audrey Powers MRN: [id number] DOB: 07/31/1951 Today's Date: 08/23/2019   History of Present Illness  Pt is a 68 yo female s/p L4-5 DIF. PMHx: back adn RLE pain, DDD, osteopenia, osteoporosis, PONV.  Clinical Impression  Pt admitted with above diagnosis. At the time of PT eval pt was able to perform transfers and ambulation with gross supervision for safety to min guard assist without an AD. Pt complains of feeling lightheaded at times, and noted BP was soft (low 90's/50's) at beginning of session. By end of session BP was 114/67 and pt reports feeling better overall. Pt was educated on signs/symptoms of hypotension, precautions, brace application/wearing schedule, car transfer and activity progression at home. Pt currently with functional limitations due to the deficits listed below (see PT Problem List). Pt will benefit from skilled PT to increase their independence and safety with mobility to allow discharge to the venue listed below.       Follow Up Recommendations No PT follow up;Supervision for mobility/OOB    Equipment Recommendations  None recommended by PT    Recommendations for Other Services       Precautions / Restrictions Precautions Precautions: Back Precaution Booklet Issued: Yes (comment) Precaution Comments: Reviewed precautions during functional mobility.  Required Braces or Orthoses: Spinal Brace Spinal Brace: Lumbar corset;Applied in sitting position Restrictions Weight Bearing Restrictions: No      Mobility  Bed Mobility Overal bed mobility: Needs Assistance Bed Mobility: Sidelying to Sit;Rolling Rolling: Modified independent (Device/Increase time) Sidelying to sit: Supervision   Sit to supine: Supervision   General bed mobility comments: verbal cues for proper technique  Transfers Overall transfer level: Needs assistance Equipment used: None Transfers: Sit to/from Stand Sit to Stand: Supervision         General transfer comment: Close supervision for safety as pt powered up to full standing position.   Ambulation/Gait Ambulation/Gait assistance: Min guard Gait Distance (Feet): 400 Feet Assistive device: None Gait Pattern/deviations: Step-through pattern;Decreased stride length;Trunk flexed Gait velocity: Decreased Gait velocity interpretation: 1.31 - 2.62 ft/sec, indicative of limited community ambulator General Gait Details: Initially appears guarded and slightly unsteady however improves once we exit the room and get out into the hallway. Close guard provided throughout gait training.   Stairs Stairs: Yes Stairs assistance: Min guard Stair Management: One rail Right;Step to pattern;Forwards Number of Stairs: 3 General stair comments: VC's for sequencing and general safety. No assist required however hands on guarding provided for safety.   Wheelchair Mobility    Modified Rankin (Stroke Patients Only)       Balance Overall balance assessment: Needs assistance Sitting-balance support: Feet supported Sitting balance-Leahy Scale: Good     Standing balance support: Single extremity supported Standing balance-Leahy Scale: Fair                               Pertinent Vitals/Pain Pain Assessment: 0-10 Pain Score: 5  Pain Location: Incision site Pain Descriptors / Indicators: Operative site guarding Pain Intervention(s): Limited activity within patient's tolerance;Monitored during session;Repositioned    Home Living Family/patient expects to be discharged to:: Private residence Living Arrangements: Spouse/significant other Available Help at Discharge: Family;Available 24 hours/day Type of Home: House Home Access: Stairs to enter Entrance Stairs-Rails: Right Entrance Stairs-Number of Steps: 3 Home Layout: One level Home Equipment: Walker - 2 wheels;Toilet riser;Shower seat;Grab bars - tub/shower;Hand held shower head;Adaptive equipment      Prior  Function  Level of Independence: Independent               Hand Dominance   Dominant Hand: Right    Extremity/Trunk Assessment   Upper Extremity Assessment Upper Extremity Assessment: Defer to OT evaluation    Lower Extremity Assessment Lower Extremity Assessment: Generalized weakness(Consistent with pre-op diagnosis)    Cervical / Trunk Assessment Cervical / Trunk Assessment: Other exceptions Cervical / Trunk Exceptions: s/p back surgery  Communication   Communication: No difficulties  Cognition Arousal/Alertness: Awake/alert Behavior During Therapy: WFL for tasks assessed/performed Overall Cognitive Status: Within Functional Limits for tasks assessed                                        General Comments      Exercises     Assessment/Plan    PT Assessment Patient needs continued PT services  PT Problem List Decreased strength;Decreased range of motion;Decreased activity tolerance;Decreased balance;Decreased mobility;Decreased knowledge of use of DME;Decreased safety awareness;Decreased knowledge of precautions;Pain       PT Treatment Interventions DME instruction;Gait training;Stair training;Functional mobility training;Therapeutic activities;Therapeutic exercise;Neuromuscular re-education;Patient/family education    PT Goals (Current goals can be found in the Care Plan section)  Acute Rehab PT Goals Patient Stated Goal: to go home Friday PT Goal Formulation: With patient Time For Goal Achievement: 08/30/19 Potential to Achieve Goals: Good    Frequency Min 5X/week   Barriers to discharge        Co-evaluation               AM-PAC PT "6 Clicks" Mobility  Outcome Measure Help needed turning from your back to your side while in a flat bed without using bedrails?: None Help needed moving from lying on your back to sitting on the side of a flat bed without using bedrails?: A Little Help needed moving to and from a bed to a chair  (including a wheelchair)?: A Little Help needed standing up from a chair using your arms (e.g., wheelchair or bedside chair)?: A Little Help needed to walk in hospital room?: A Little Help needed climbing 3-5 steps with a railing? : A Little 6 Click Score: 19    End of Session Equipment Utilized During Treatment: Gait belt Activity Tolerance: Patient tolerated treatment well Patient left: in chair;with call bell/phone within reach Nurse Communication: Mobility status PT Visit Diagnosis: Unsteadiness on feet (R26.81);Pain;Other symptoms and signs involving the nervous system (R29.898)    Time: [id number] PT Time Calculation (min) (ACUTE ONLY): 27 min   Charges:   PT Evaluation $PT Eval Moderate Complexity: 1 Mod PT Treatments $Gait Training: 8-22 mins        [name], PT, DPT Acute Rehabilitation Services Pager: [phone] Office: [place] 08/23/2019, 10:02 AM

## 2019-08-23 NOTE — Progress Notes (Signed)
Subjective: The patient is alert and pleasant.  She looks well.  She denies headache.  Objective: Vital signs in last 24 hours: Temp:  [97.6 F (36.4 C)-98.7 F (37.1 C)] 98.7 F (37.1 C) (08/27 0719) Pulse Rate:  [67-98] 72 (08/27 0719) Resp:  [16-22] 16 (08/27 0719) BP: (95-132)/(48-68) 98/49 (08/27 0719) SpO2:  [96 %-100 %] 99 % (08/27 0719) Weight:  [51 kg] 51 kg (08/26 1014) Estimated body mass index is 21.06 kg/m as calculated from the following:   Height as of this encounter: 5' 1.25" (1.556 m).   Weight as of this encounter: 51 kg.   Intake/Output from previous day: 08/26 0701 - 08/27 0700 In: 1850 [I.V.:1600; IV Piggyback:250] Out: 1175 [Urine:975; Blood:200] Intake/Output this shift: No intake/output data recorded.  Physical exam the patient is alert and pleasant.  Her strength is normal in her lower extremity except her right EHL was 4/5.  Lab Results: Recent Labs    08/20/19 0935 08/23/19 0632  WBC 6.0 14.3*  HGB 14.4 10.9*  HCT 42.4 31.6*  PLT 210 166   BMET Recent Labs    08/20/19 0935 08/23/19 0632  NA 141 139  K 4.2 3.9  CL 106 107  CO2 26 23  GLUCOSE 89 170*  BUN 14 13  CREATININE 0.65 0.80  CALCIUM 9.4 8.3*    Studies/Results: Dg Lumbar Spine 2-3 Views  Result Date: 08/22/2019 CLINICAL DATA:  68 year old female undergoing L4-L5 and L5-S1 posterior lumbar interbody fusion EXAM: LUMBAR SPINE - 2-3 VIEW; DG C-ARM 1-60 MIN COMPARISON:  Intraoperative radiographs obtained earlier today FINDINGS: AP and cross-table lateral intraoperative radiographs demonstrate in progress posterior lumbar interbody fusion. Bilateral pedicle screws are present at S1, L5 and L4. Interbody grafts are present at L4-L5 and L5-S1. No evidence of immediate hardware complication. IMPRESSION: In progress L4-L5 and L5-S1 posterior lumbar interbody fusion with interbody grafts. Electronically Signed   By: Jacqulynn Cadet M.D.   On: 08/22/2019 18:27   Dg Lumbar Spine 1  View  Result Date: 08/22/2019 CLINICAL DATA:  Lumbar fusion EXAM: LUMBAR SPINE - 1 VIEW COMPARISON:  CT myelogram 07/11/2019 FINDINGS: Single lateral intraoperative radiograph demonstrates posterior surgical instruments, tip of probe projecting just inferior to the L5 spinous process. IMPRESSION: Intraoperative localization. Electronically Signed   By: Lucrezia Europe M.D.   On: 08/22/2019 16:53   Dg C-arm 1-60 Min  Result Date: 08/22/2019 CLINICAL DATA:  68 year old female undergoing L4-L5 and L5-S1 posterior lumbar interbody fusion EXAM: LUMBAR SPINE - 2-3 VIEW; DG C-ARM 1-60 MIN COMPARISON:  Intraoperative radiographs obtained earlier today FINDINGS: AP and cross-table lateral intraoperative radiographs demonstrate in progress posterior lumbar interbody fusion. Bilateral pedicle screws are present at S1, L5 and L4. Interbody grafts are present at L4-L5 and L5-S1. No evidence of immediate hardware complication. IMPRESSION: In progress L4-L5 and L5-S1 posterior lumbar interbody fusion with interbody grafts. Electronically Signed   By: Jacqulynn Cadet M.D.   On: 08/22/2019 18:27    Assessment/Plan: Stop day #1: The patient is doing well.  We will mobilize her.  She will likely go home tomorrow.  LOS: 1 day     Audrey Powers 08/23/2019, 8:02 AM

## 2019-08-23 NOTE — Evaluation (Signed)
Occupational Therapy Evaluation Patient Details Name: Audrey Powers MRN: [id number] DOB: 06/12/1951 Today's Date: 08/23/2019    History of Present Illness Pt is a 68 yo female s/p L4-5 DIF. PMHx: back adn RLE pain, DDD, osteopenia, osteoporosis, PONV.   Clinical Impression   Pt PTA: pt living with spouse and independent. Pt currently performing ADL functional mobility with handheld assist, most likely needs a RW for stability. Pt performing minguardA for ADL overall. Pt shown AE and pt able to donn LB undergarments with figure 4 technique and reacher. Pt reports that her spouse is able to assist as needed. Pt is going to use [place] in shower for safety. Back handout provided. Pt educated on: brace donning/doffing, set an alarm at night for medication, avoid sitting for long periods of time, correct bed positioning for sleeping, correct sequence for bed mobility, avoiding lifting more than 5 pounds and never wash directly over incision. All education is complete and patient indicates understanding. Pt does not require continued OT skilled services. OT signing off.    Follow Up Recommendations  No OT follow up    Equipment Recommendations  None recommended by OT    Recommendations for Other Services       Precautions / Restrictions Precautions Precautions: Back Precaution Booklet Issued: Yes (comment) Precaution Comments: verbally discussed handout Restrictions Weight Bearing Restrictions: No      Mobility Bed Mobility Overal bed mobility: Needs Assistance Bed Mobility: Sidelying to Sit;Sit to Supine   Sidelying to sit: Supervision   Sit to supine: Supervision   General bed mobility comments: verbal cues for proper technique  Transfers Overall transfer level: Needs assistance Equipment used: None Transfers: Sit to/from Stand Sit to Stand: Min guard         General transfer comment: Pt dizzy throughout tasks in standing and vomitted x1 time. Pt's BP remains low.     Balance Overall balance assessment: Needs assistance Sitting-balance support: Feet supported Sitting balance-Leahy Scale: Good     Standing balance support: Single extremity supported Standing balance-Leahy Scale: Fair                             ADL either performed or assessed with clinical judgement   ADL Overall ADL's : Needs assistance/impaired Eating/Feeding: Modified independent;Sitting   Grooming: Min guard;Standing Grooming Details (indicate cue type and reason): Pt stood at sink for tooth brushing and 2 cups to rinse/spit Upper Body Bathing: Set up;Sitting   Lower Body Bathing: Supervison/ safety;Sitting/lateral leans;Sit to/from stand;With adaptive equipment   Upper Body Dressing : Set up;Sitting   Lower Body Dressing: Cueing for safety;Cueing for sequencing;With adaptive equipment;Sitting/lateral leans;Sit to/from stand;Min guard Lower Body Dressing Details (indicate cue type and reason): Pt shown AE and pt able to donn LB undergarments with figure 4 technique and reacher. Pt reports that her spouse is able to assist as needed. Pt is going to use [place] in shower for safety. Toilet Transfer: Designer, fashion/clothing and Hygiene: Min guard       Functional mobility during ADLs: Min guard;Cueing for safety General ADL Comments: Pt performing ADL functional mobility with minguardA; set-upA for UB ADL and minguardA overall for LB ADL.     Vision Baseline Vision/History: Wears glasses Wears Glasses: At all times Patient Visual Report: No change from baseline Vision Assessment?: No apparent visual deficits     Perception     Praxis      Pertinent Vitals/Pain  Hand Dominance Right   Extremity/Trunk Assessment Upper Extremity Assessment Upper Extremity Assessment: Overall WFL for tasks assessed   Lower Extremity Assessment Lower Extremity Assessment: Generalized weakness;Overall [facility] for tasks assessed   Cervical / Trunk  Assessment Cervical / Trunk Assessment: Other exceptions Cervical / Trunk Exceptions: s/p back surgery   Communication Communication Communication: No difficulties   Cognition Arousal/Alertness: Awake/alert Behavior During Therapy: WFL for tasks assessed/performed Overall Cognitive Status: Within Functional Limits for tasks assessed                                     General Comments       Exercises     Shoulder Instructions      Home Living Family/patient expects to be discharged to:: Private residence Living Arrangements: Spouse/significant other Available Help at Discharge: Family;Available 24 hours/day Type of Home: House Home Access: Stairs to enter [employer] of Steps: 3 Entrance Stairs-Rails: Left Home Layout: One level     Bathroom Shower/Tub: Walk-in shower;Tub/shower unit   Bathroom Toilet: Handicapped height     Home Equipment: Environmental consultant - 2 wheels;Toilet riser;Shower seat;Grab bars - tub/shower;Hand held shower head;Adaptive equipment Adaptive Equipment: Reacher        Prior Functioning/Environment Level of Independence: Independent                 OT Problem List: Decreased strength;Decreased activity tolerance;Impaired balance (sitting and/or standing);Decreased safety awareness;Pain      OT Treatment/Interventions:      OT Goals(Current goals can be found in the care plan section) Acute Rehab OT Goals Patient Stated Goal: to go home OT Goal Formulation: With patient Time For Goal Achievement: 08/30/19 Potential to Achieve Goals: Good  OT Frequency:     Barriers to D/C:            Co-evaluation              AM-PAC OT "6 Clicks" Daily Activity     Outcome Measure Help from another person eating meals?: None Help from another person taking care of personal grooming?: A Little Help from another person toileting, which includes using toliet, bedpan, or urinal?: A Little Help from another person bathing  (including washing, rinsing, drying)?: A Little Help from another person to put on and taking off regular upper body clothing?: None Help from another person to put on and taking off regular lower body clothing?: A Little 6 Click Score: 20   End of Session Equipment Utilized During Treatment: Gait belt;Back brace Nurse Communication: Mobility status  Activity Tolerance: Treatment limited secondary to medical complications (Comment);Patient tolerated treatment well(N/V) Patient left: in bed;with call bell/phone within reach  OT Visit Diagnosis: Unsteadiness on feet (R26.81);Muscle weakness (generalized) (M62.81);Pain Pain - part of body: (back)                Time: [id number] OT Time Calculation (min): 38 min Charges:  OT General Charges $OT Visit: 1 Visit OT Evaluation $OT Eval Moderate Complexity: 1 Mod OT Treatments $Self Care/Home Management : 23-37 mins  Ebony Hail Harold Hedge) Marsa Aris OTR/L Acute Rehabilitation Services Pager: [phone] Office: [place] 08/23/2019, 8:57 AM

## 2019-08-23 NOTE — Anesthesia Postprocedure Evaluation (Signed)
Anesthesia Post Note  Patient: Audrey Powers  Procedure(s) Performed: POSTERIOR LUMBAR INTERBODY FUSION WITH INSTRUMENTATION LUMBAR FOUR- LUMBAR FIVE, LUMBAR FIVE- SACRAL ONE (N/A Spine Lumbar)     Patient location during evaluation: PACU Anesthesia Type: General Level of consciousness: awake and sedated Pain management: pain level controlled Vital Signs Assessment: post-procedure vital signs reviewed and stable Respiratory status: spontaneous breathing Cardiovascular status: stable Postop Assessment: patient able to bend at knees Anesthetic complications: yes Anesthetic complication details: PONV   Last Vitals:  Vitals:   08/23/19 0356 08/23/19 0719  BP: (!) 95/48 (!) 98/49  Pulse: 73 72  Resp: 18 16  Temp: 36.9 C 37.1 C  SpO2: 99% 99%    Last Pain:  Vitals:   08/23/19 0719  TempSrc: Oral  PainSc:    Pain Goal: Patients Stated Pain Goal: 3 (08/22/19 2058)                 Huston Foley

## 2019-08-24 MED ORDER — OXYCODONE HCL 5 MG PO TABS: 5.0000 mg | ORAL_TABLET | ORAL | 0 refills | Status: DC | PRN

## 2019-08-24 MED ORDER — DOCUSATE SODIUM 100 MG PO CAPS: 100.0000 mg | ORAL_CAPSULE | Freq: Two times a day (BID) | ORAL | 0 refills | Status: DC

## 2019-08-24 NOTE — Progress Notes (Signed)
Patient alert and oriented, mae's well, voiding adequate amount of urine, swallowing without difficulty, no c/o pain at time of discharge. Patient discharged home with family. Script and discharged instructions given to patient. Patient and family stated understanding of instructions given. Patient has an appointment with Dr. Jenkins   

## 2019-08-24 NOTE — Discharge Summary (Signed)
Physician Discharge Summary  Patient ID: Audrey Powers MRN: [id number] DOB/AGE: 01-Jun-1951 68 y.o.  Admit date: 08/22/2019 Discharge date: 08/24/2019  Admission Diagnoses: Adult degenerative scoliosis, lumbar and lumbosacral neuroforaminal stenosis, lumbar ranges, lumbar radiculopathy, neurogenic claudication  Discharge Diagnoses: The same Active Problems:   Degenerative scoliosis in adult patient   Discharged Condition: good  Hospital Course: I performed an L4-5 and L5-S1 decompression, instrumentation and fusion on the patient on 08/22/2019.  The patient's postoperative course was unremarkable.  On postoperative day 2 she requested discharge to home.  She was given written and oral discharge instructions.  All her questions were answered.  Consults: Physical therapy, occupational therapy, care management Significant Diagnostic Studies: None Treatments: L4-5 and L5-S1 decompression, instrumentation and fusion. Discharge Exam: Blood pressure (!) 90/44, pulse 81, temperature 99.6 F (37.6 C), temperature source Oral, resp. rate 18, height 5' 1.25" (1.556 m), weight 51 kg, SpO2 96 %. The patient is alert and pleasant.  She looks well.  Her dressing has a small old bloodstain.  Her lower extremity strength is normal except her right EHL is 4/5.  Disposition: Home  Discharge Instructions    Call MD for:  difficulty breathing, headache or visual disturbances   Complete by: As directed    Call MD for:  extreme fatigue   Complete by: As directed    Call MD for:  hives   Complete by: As directed    Call MD for:  persistant dizziness or light-headedness   Complete by: As directed    Call MD for:  persistant nausea and vomiting   Complete by: As directed    Call MD for:  redness, tenderness, or signs of infection (pain, swelling, redness, odor or green/yellow discharge around incision site)   Complete by: As directed    Call MD for:  severe uncontrolled pain   Complete by: As  directed    Call MD for:  temperature >100.4   Complete by: As directed    Diet - low sodium heart healthy   Complete by: As directed    Discharge instructions   Complete by: As directed    Call [phone] for a followup appointment. Take a stool softener while you are using pain medications.   Driving Restrictions   Complete by: As directed    Do not drive for 2 weeks.   Increase activity slowly   Complete by: As directed    Lifting restrictions   Complete by: As directed    Do not lift more than 5 pounds. No excessive bending or twisting.   May shower / Bathe   Complete by: As directed    Remove the dressing for 3 days after surgery.  You may shower, but leave the incision alone.   Remove dressing in 24 hours   Complete by: As directed      Allergies as of 08/24/2019      Reactions   Codeine Nausea And Vomiting   Erythromycin Nausea Only   Sumatriptan Other (See Comments)   Not effective   Topamax [topiramate] Other (See Comments)   dizzy   Tramadol Nausea And Vomiting   Headaches, dizziness   Hydrocodone Nausea Only   dizziness      Medication List    TAKE these medications   ALPRAZolam 0.25 MG tablet Commonly known as: XANAX Take 0.25 mg by mouth 2 (two) times daily as needed for anxiety.   b complex vitamins tablet Take 1 tablet by mouth every other day.  Biotin 1000 MCG tablet Take 1,000 mcg by mouth daily.   Blink Tears 0.25 % Soln Generic drug: Polyethylene Glycol 400 Place 1 drop into both eyes 2 (two) times daily as needed (dry eyes).   cyclobenzaprine 5 MG tablet Commonly known as: FLEXERIL Take 5 mg by mouth 3 (three) times daily as needed for muscle spasms.   denosumab 60 MG/ML Soln injection Commonly known as: PROLIA Inject 60 mg into the skin every 6 (six) months. Administer in upper arm, thigh, or abdomen   diclofenac sodium 1 % Gel Commonly known as: VOLTAREN Apply 1 application topically 3 (three) times daily as needed (pain).    dicyclomine 10 MG capsule Commonly known as: BENTYL Take 10 mg by mouth 3 (three) times daily as needed for spasms.   docusate sodium 100 MG capsule Commonly known as: COLACE Take 1 capsule (100 mg total) by mouth 2 (two) times daily.   famotidine 40 MG tablet Commonly known as: PEPCID Take 40 mg by mouth daily as needed for heartburn.   gabapentin 300 MG capsule Commonly known as: NEURONTIN Take 300 mg by mouth 3 (three) times daily.   multivitamin tablet Take 1 tablet by mouth daily.   naproxen 500 MG tablet Commonly known as: NAPROSYN Take 500 mg by mouth 2 (two) times daily as needed for moderate pain.   ondansetron 4 MG tablet Commonly known as: ZOFRAN Take 4 mg by mouth every 8 (eight) hours as needed for nausea or vomiting.   oxyCODONE 5 MG immediate release tablet Commonly known as: Oxy IR/ROXICODONE Take 1 tablet (5 mg total) by mouth every 4 (four) hours as needed for moderate pain ((score 4 to 6)).   phenylephrine-shark liver oil-mineral oil-petrolatum 0.25-3-14-71.9 % rectal ointment Commonly known as: PREPARATION H Place 1 application rectally 2 (two) times daily as needed for hemorrhoids.   pravastatin 20 MG tablet Commonly known as: PRAVACHOL Take 20 mg by mouth daily.   prednisoLONE acetate 1 % ophthalmic suspension Commonly known as: PRED FORTE Place 1 drop into the right eye 2 (two) times daily.   propranolol 10 MG tablet Commonly known as: INDERAL Take 10 mg by mouth 2 (two) times daily.   rizatriptan 10 MG tablet Commonly known as: MAXALT Take 10 mg by mouth as needed for migraine. May repeat in 2 hours if needed        Signed: Ophelia Charter 08/24/2019, 6:49 AM

## 2019-08-24 NOTE — Progress Notes (Signed)
Physical Therapy Treatment Patient Details Name: Audrey Powers MRN: [id number] DOB: 06/22/1951 Today's Date: 08/24/2019    History of Present Illness Pt is a 68 yo female s/p L4-5 DIF. PMHx: back adn RLE pain, DDD, osteopenia, osteoporosis, PONV.    PT Comments    Pt progressing well with post-op mobility. No assist required throughout session and pt was able to recall 3/3 precautions without difficulty. Reinforced education regarding car transfers and activity progression. Pt anticipates d/c home today.     Follow Up Recommendations  No PT follow up;Supervision for mobility/OOB     Equipment Recommendations  None recommended by PT    Recommendations for Other Services       Precautions / Restrictions Precautions Precautions: Back;Fall Precaution Booklet Issued: Yes (comment) Precaution Comments: Reviewed precautions during functional mobility.  Required Braces or Orthoses: Spinal Brace Spinal Brace: Lumbar corset;Applied in sitting position Restrictions Weight Bearing Restrictions: No    Mobility  Bed Mobility Overal bed mobility: Modified Independent Bed Mobility: Sidelying to Sit;Rolling Rolling: Modified independent (Device/Increase time) Sidelying to sit: Modified independent (Device/Increase time)       General bed mobility comments: Pt demonstrated proper log roll technique without assistance.   Transfers Overall transfer level: Modified independent Equipment used: None Transfers: Sit to/from Stand           General transfer comment: Pt demonstrated proper hand placement on seated surface for safety.   Ambulation/Gait Ambulation/Gait assistance: Modified independent (Device/Increase time) Gait Distance (Feet): 400 Feet Assistive device: None Gait Pattern/deviations: Step-through pattern;Decreased stride length;Trunk flexed Gait velocity: Decreased Gait velocity interpretation: 1.31 - 2.62 ft/sec, indicative of limited community  ambulator General Gait Details: No assist required. Pt appears slightly unsteady initially however progresses to mod I by end of session.    Stairs             Wheelchair Mobility    Modified Rankin (Stroke Patients Only)       Balance Overall balance assessment: Needs assistance Sitting-balance support: Feet supported Sitting balance-Leahy Scale: Good     Standing balance support: Single extremity supported Standing balance-Leahy Scale: Fair                              Cognition Arousal/Alertness: Awake/alert Behavior During Therapy: WFL for tasks assessed/performed Overall Cognitive Status: Within Functional Limits for tasks assessed                                        Exercises      General Comments        Pertinent Vitals/Pain Pain Assessment: Faces Faces Pain Scale: Hurts a little bit Pain Location: Incision site Pain Descriptors / Indicators: Operative site guarding;Discomfort Pain Intervention(s): Monitored during session    Home Living                      Prior Function            PT Goals (current goals can now be found in the care plan section) Acute Rehab PT Goals Patient Stated Goal: to go home today PT Goal Formulation: With patient Time For Goal Achievement: 08/30/19 Potential to Achieve Goals: Good Progress towards PT goals: Progressing toward goals    Frequency    Min 5X/week      PT Plan Current plan remains appropriate  Co-evaluation              AM-PAC PT "6 Clicks" Mobility   Outcome Measure  Help needed turning from your back to your side while in a flat bed without using bedrails?: None Help needed moving from lying on your back to sitting on the side of a flat bed without using bedrails?: A Little Help needed moving to and from a bed to a chair (including a wheelchair)?: A Little Help needed standing up from a chair using your arms (e.g., wheelchair or bedside  chair)?: A Little Help needed to walk in hospital room?: A Little Help needed climbing 3-5 steps with a railing? : A Little 6 Click Score: 19    End of Session Equipment Utilized During Treatment: Gait belt Activity Tolerance: Patient tolerated treatment well Patient left: in chair;with call bell/phone within reach Nurse Communication: Mobility status PT Visit Diagnosis: Unsteadiness on feet (R26.81);Pain;Other symptoms and signs involving the nervous system [id number])     Time: [id number] PT Time Calculation (min) (ACUTE ONLY): 15 min  Charges:  $Gait Training: 8-22 mins                     [name], PT, DPT Acute Rehabilitation Services Pager: [phone] Office: [phone]    [name] 08/24/2019, 2:04 PM

## 2019-08-28 ENCOUNTER — Encounter (HOSPITAL_COMMUNITY): Payer: Self-pay | Admitting: Neurosurgery

## 2019-08-28 MED FILL — Heparin Sodium (Porcine) Inj 1000 Unit/ML: INTRAMUSCULAR | Qty: 30 | Status: AC

## 2019-08-28 MED FILL — Sodium Chloride IV Soln 0.9%: INTRAVENOUS | Qty: 1000 | Status: AC

## 2019-09-20 ENCOUNTER — Other Ambulatory Visit: Payer: Self-pay | Admitting: Family Medicine

## 2019-09-20 DIAGNOSIS — Z1231 Encounter for screening mammogram for malignant neoplasm of breast: Secondary | ICD-10-CM

## 2019-11-05 ENCOUNTER — Other Ambulatory Visit: Payer: Self-pay

## 2019-11-05 ENCOUNTER — Ambulatory Visit
Admission: RE | Admit: 2019-11-05 | Discharge: 2019-11-05 | Disposition: A | Discharge status: Home / Self Care | Payer: Medicare Other | Source: Ambulatory Visit | Attending: Family Medicine | Admitting: Family Medicine

## 2019-11-05 DIAGNOSIS — Z1231 Encounter for screening mammogram for malignant neoplasm of breast: Secondary | ICD-10-CM

## 2020-05-27 HISTORY — PX: CERVICAL FUSION: SHX112

## 2020-06-19 DIAGNOSIS — M4722 Other spondylosis with radiculopathy, cervical region: Secondary | ICD-10-CM | POA: Diagnosis not present

## 2020-06-19 DIAGNOSIS — M4712 Other spondylosis with myelopathy, cervical region: Secondary | ICD-10-CM | POA: Diagnosis not present

## 2020-07-02 DIAGNOSIS — F419 Anxiety disorder, unspecified: Secondary | ICD-10-CM | POA: Diagnosis not present

## 2020-07-02 DIAGNOSIS — Z0000 Encounter for general adult medical examination without abnormal findings: Secondary | ICD-10-CM | POA: Diagnosis not present

## 2020-07-02 DIAGNOSIS — Z1389 Encounter for screening for other disorder: Secondary | ICD-10-CM | POA: Diagnosis not present

## 2020-07-02 DIAGNOSIS — K581 Irritable bowel syndrome with constipation: Secondary | ICD-10-CM | POA: Diagnosis not present

## 2020-07-02 DIAGNOSIS — E785 Hyperlipidemia, unspecified: Secondary | ICD-10-CM | POA: Diagnosis not present

## 2020-07-02 DIAGNOSIS — R0789 Other chest pain: Secondary | ICD-10-CM | POA: Diagnosis not present

## 2020-07-02 DIAGNOSIS — M810 Age-related osteoporosis without current pathological fracture: Secondary | ICD-10-CM | POA: Diagnosis not present

## 2020-07-02 DIAGNOSIS — G43009 Migraine without aura, not intractable, without status migrainosus: Secondary | ICD-10-CM | POA: Diagnosis not present

## 2020-07-02 DIAGNOSIS — Z1159 Encounter for screening for other viral diseases: Secondary | ICD-10-CM | POA: Diagnosis not present

## 2020-07-03 ENCOUNTER — Other Ambulatory Visit: Payer: Self-pay | Admitting: Family Medicine

## 2020-07-03 DIAGNOSIS — M810 Age-related osteoporosis without current pathological fracture: Secondary | ICD-10-CM

## 2020-07-03 DIAGNOSIS — Z1231 Encounter for screening mammogram for malignant neoplasm of breast: Secondary | ICD-10-CM

## 2020-07-12 DIAGNOSIS — Z8249 Family history of ischemic heart disease and other diseases of the circulatory system: Secondary | ICD-10-CM | POA: Diagnosis not present

## 2020-07-12 DIAGNOSIS — Z79899 Other long term (current) drug therapy: Secondary | ICD-10-CM | POA: Diagnosis not present

## 2020-07-12 DIAGNOSIS — E1042 Type 1 diabetes mellitus with diabetic polyneuropathy: Secondary | ICD-10-CM | POA: Diagnosis not present

## 2020-07-12 DIAGNOSIS — M1990 Unspecified osteoarthritis, unspecified site: Secondary | ICD-10-CM | POA: Diagnosis not present

## 2020-07-12 DIAGNOSIS — K219 Gastro-esophageal reflux disease without esophagitis: Secondary | ICD-10-CM | POA: Diagnosis not present

## 2020-07-12 DIAGNOSIS — F419 Anxiety disorder, unspecified: Secondary | ICD-10-CM | POA: Diagnosis not present

## 2020-07-12 DIAGNOSIS — Z823 Family history of stroke: Secondary | ICD-10-CM | POA: Diagnosis not present

## 2020-07-12 DIAGNOSIS — E785 Hyperlipidemia, unspecified: Secondary | ICD-10-CM | POA: Diagnosis not present

## 2020-07-12 DIAGNOSIS — G43909 Migraine, unspecified, not intractable, without status migrainosus: Secondary | ICD-10-CM | POA: Diagnosis not present

## 2020-07-15 DIAGNOSIS — M4712 Other spondylosis with myelopathy, cervical region: Secondary | ICD-10-CM | POA: Diagnosis not present

## 2020-07-31 DIAGNOSIS — M542 Cervicalgia: Secondary | ICD-10-CM | POA: Diagnosis not present

## 2020-08-04 DIAGNOSIS — M542 Cervicalgia: Secondary | ICD-10-CM | POA: Diagnosis not present

## 2020-08-07 DIAGNOSIS — M542 Cervicalgia: Secondary | ICD-10-CM | POA: Diagnosis not present

## 2020-08-08 DIAGNOSIS — G5602 Carpal tunnel syndrome, left upper limb: Secondary | ICD-10-CM | POA: Diagnosis not present

## 2020-08-11 DIAGNOSIS — M542 Cervicalgia: Secondary | ICD-10-CM | POA: Diagnosis not present

## 2020-08-12 DIAGNOSIS — H9190 Unspecified hearing loss, unspecified ear: Secondary | ICD-10-CM | POA: Diagnosis not present

## 2020-08-12 DIAGNOSIS — H903 Sensorineural hearing loss, bilateral: Secondary | ICD-10-CM | POA: Diagnosis not present

## 2020-08-14 DIAGNOSIS — M542 Cervicalgia: Secondary | ICD-10-CM | POA: Diagnosis not present

## 2020-08-18 DIAGNOSIS — M542 Cervicalgia: Secondary | ICD-10-CM | POA: Diagnosis not present

## 2020-08-18 NOTE — Progress Notes (Signed)
Cardiology Consult Note    Date:  08/19/2020   ID:  Audrey Powers, Audrey Powers 11-25-1951, MRN [id number]  PCP:  Harlan Stains, MD  Cardiologist:  Fransico Him, MD   Chief Complaint  Patient presents with  . New Patient (Initial Visit)    Chest pain    History of Present Illness:  Audrey Powers is a 69 y.o. female who is being seen today for the evaluation of chest pain at the request of Harlan Stains, MD.  This is a 69yo female with a hx of GERD, HLD and remote hx of tobacco use.  She has recently had problems with chest pain and is now referred for further evaluation.   She tells me that she has been having chest discomfort that she describes as a squeezing sensation with no associated DOE/diaphoresis or nausea.  This occurs about 2 times yearly for the past 5 years.  This occurs both at rest and in bed at night.  She works out at [employer] and has no problems with CP when she exerts herself.  She walks 1 mile for exercise.  She does get some DOE when walking up hills and bending over to pick up sticks in her back yard.  She denies any PND, orthopnea, LE edema, dizziness or syncope.  Rarely she will have a brief fluttering in her chest for a few seconds.   Past Medical History:  Diagnosis Date  . Anxiety   . Arthritis   . Bunion   . DDD (degenerative disc disease), cervical   . Degenerative scoliosis in adult patient 08/22/2019  . Depression   . Endometrial polyp   . Fibrocystic breast changes   . Fundic gland polyps of stomach, benign 08/2016  . GERD (gastroesophageal reflux disease)   . Headache, migraine   . Hirsutism   . Hyperlipidemia   . IBS (irritable bowel syndrome)   . Internal and external prolapsed hemorrhoids 09/04/2018  . Iron deficiency anemia   . Osteoarthritis   . Osteoporosis   . PONV (postoperative nausea and vomiting)   . Tinnitus     Past Surgical History:  Procedure Laterality Date  . COLONOSCOPY    . DILATION AND CURETTAGE OF UTERUS   1995   x 2  . EUS    . HYSTEROSCOPY    . TUBAL LIGATION  1989  . UPPER GASTROINTESTINAL ENDOSCOPY      Current Medications: Current Meds  Medication Sig  . ALPRAZolam (XANAX) 0.25 MG tablet Take 0.25 mg by mouth 2 (two) times daily as needed for anxiety.   Marland Kitchen b complex vitamins tablet Take 1 tablet by mouth every other day.   . Biotin 1000 MCG tablet Take 1,000 mcg by mouth daily.  . cyclobenzaprine (FLEXERIL) 5 MG tablet Take 5 mg by mouth 3 (three) times daily as needed for muscle spasms.   Marland Kitchen denosumab (PROLIA) 60 MG/ML SOLN injection Inject 60 mg into the skin every 6 (six) months. Administer in upper arm, thigh, or abdomen  . diclofenac sodium (VOLTAREN) 1 % GEL Apply 1 application topically 3 (three) times daily as needed (pain).   Marland Kitchen dicyclomine (BENTYL) 10 MG capsule Take 10 mg by mouth 3 (three) times daily as needed for spasms.   . famotidine (PEPCID) 40 MG tablet Take 40 mg by mouth daily as needed for heartburn.   . gabapentin (NEURONTIN) 300 MG capsule Take 300 mg by mouth 3 (three) times daily.  . Multiple Vitamin (MULTIVITAMIN) tablet Take 1  tablet by mouth daily.  . naproxen (NAPROSYN) 500 MG tablet Take 500 mg by mouth 2 (two) times daily as needed for moderate pain.   Marland Kitchen ondansetron (ZOFRAN) 4 MG tablet Take 4 mg by mouth every 8 (eight) hours as needed for nausea or vomiting.   . phenylephrine-shark liver oil-mineral oil-petrolatum (PREPARATION H) 0.25-3-14-71.9 % rectal ointment Place 1 application rectally 2 (two) times daily as needed for hemorrhoids.   . pravastatin (PRAVACHOL) 20 MG tablet Take 20 mg by mouth daily.  . prednisoLONE acetate (PRED FORTE) 1 % ophthalmic suspension Place 1 drop into the right eye 2 (two) times daily as needed (DRY EYE).   . rizatriptan (MAXALT) 10 MG tablet Take 10 mg by mouth as needed for migraine. May repeat in 2 hours if needed     Allergies:   Codeine, Erythromycin, Sumatriptan, Topamax [topiramate], Tramadol, and Hydrocodone    Social History   Socioeconomic History  . Marital status: Married    Spouse name: Not on file  . Number of children: 2  . Years of education: Not on file  . Highest education level: Not on file  Occupational History  . Occupation: retired Pharmacist, hospital  Tobacco Use  . Smoking status: Former Smoker    Quit date: 12/27/1966    Years since quitting: 53.6  . Smokeless tobacco: Never Used  Vaping Use  . Vaping Use: Never used  Substance and Sexual Activity  . Alcohol use: No  . Drug use: No  . Sexual activity: Not on file  Other Topics Concern  . Not on file  Social History Narrative   Retired Pharmacist, hospital she is married 2 grown children   Former smoker no alcohol no substances/drugs   Social Determinants of Radio broadcast assistant Strain:   . Difficulty of Paying Living Expenses: Not on file  Food Insecurity:   . Worried About Charity fundraiser in the Last Year: Not on file  . Ran Out of Food in the Last Year: Not on file  Transportation Needs:   . Lack of Transportation (Medical): Not on file  . Lack of Transportation (Non-Medical): Not on file  Physical Activity:   . Days of Exercise per Week: Not on file  . Minutes of Exercise per Session: Not on file  Stress:   . Feeling of Stress : Not on file  Social Connections:   . Frequency of Communication with Friends and Family: Not on file  . Frequency of Social Gatherings with Friends and Family: Not on file  . Attends Religious Services: Not on file  . Active Member of Clubs or Organizations: Not on file  . Attends Archivist Meetings: Not on file  . Marital Status: Not on file     Family History:  The patient's family history includes Arthritis in her maternal grandmother; COPD in her mother; CVA in her maternal grandfather, maternal grandmother, and mother; GER disease in her sister; Hyperlipidemia in her sister; Hypertension in her father, maternal grandfather, maternal grandmother, and mother; Pulmonary  fibrosis in her mother.   ROS:   Please see the history of present illness.    ROS All other systems reviewed and are negative.  No flowsheet data found.     PHYSICAL EXAM:   VS:  BP 120/60   Pulse 72   Ht 5' 1" (1.549 m)   Wt 116 lb (52.6 kg)   SpO2 97%   BMI 21.92 kg/m    GEN: Well nourished, well developed,  in no acute distress  HEENT: normal  Neck: no JVD, carotid bruits, or masses Cardiac: RRR; no murmurs, rubs, or gallops,no edema.  Intact distal pulses bilaterally.  Respiratory:  clear to auscultation bilaterally, normal work of breathing GI: soft, nontender, nondistended, + BS MS: no deformity or atrophy  Skin: warm and dry, no rash Neuro:  Alert and Oriented x 3, Strength and sensation are intact Psych: euthymic mood, full affect  Wt Readings from Last 3 Encounters:  08/19/20 116 lb (52.6 kg)  08/22/19 112 lb 6.4 oz (51 kg)  08/20/19 112 lb 6.4 oz (51 kg)      Studies/Labs Reviewed:   EKG:  EKG is ordered today.  The ekg ordered today demonstrates NSR with IRBBB  Recent Labs: 08/23/2019: BUN 13; Creatinine, Ser 0.80; Hemoglobin 10.9; Platelets 166; Potassium 3.9; Sodium 139   Lipid Panel No results found for: CHOL, TRIG, HDL, CHOLHDL, VLDL, LDLCALC, LDLDIRECT  Additional studies/ records that were reviewed today include:  OV notes from PCP    ASSESSMENT:    1. Chest pain of uncertain etiology   2. Hyperlipidemia LDL goal <70   3. Precordial pain      PLAN:  In order of problems listed above:  1.  Chest pain  -her CP is atypical and EKG is nonischemic -her CRF include postmenopause state and HTN -I will get a coronary CTA to rule out CAD -if CTA is low risk then consider workup for GERD/esophageal spasm  2.  HLD -LDL was 92 last month and TAGs elevated at 173 -continue Pravastatin 43m daily    Medication Adjustments/Labs and Tests Ordered: Current medicines are reviewed at length with the patient today.  Concerns regarding medicines  are outlined above.  Medication changes, Labs and Tests ordered today are listed in the Patient Instructions below.  Patient Instructions  Medication Instructions:  Your physician recommends that you continue on your current medications as directed. Please refer to the Current Medication list given to you today.  *If you need a refill on your cardiac medications before your next appointment, please call your pharmacy*   Testing/Procedures: Your physician has recommended that you have a Coronary CT scan. Please see below for further instructions.    Follow-Up: [facility] and your [facility] are our [facility].  As part of our continuing mission to provide you with exceptional heart care, we have created designated Provider Care Teams.  These Care Teams include your primary Cardiologist (physician) and Advanced Practice Providers (APPs -  Physician Assistants and Nurse Practitioners) who all work together to provide you with the care you need, when you need it.  Follow up with Dr. TRadford Paxas needed based on results of testing.    Other Instructions Your cardiac CT will be scheduled at one of the below locations:   [facility]GE[address] [place] [phone] [facility] minutes prior to test start time. [facility] (first floor) to check-in and test prep.  Please follow these instructions carefully (unless otherwise directed):    On the Night Before the Test: . Be sure to Drink plenty of water. . Do not consume any caffeinated/decaffeinated beverages or chocolate 12 hours prior to your test. . Do not take any antihistamines 12 hours prior to your test.  On the Day of the Test: . Drink plenty of water. Do not drink any  water within one hour of the test. . Do not eat any food 4 hours prior to the test. . You may take your regular  medications prior to the test.  . Take metoprolol (Lopressor) two hours prior to test. . HOLD Furosemide/Hydrochlorothiazide morning of the test. . FEMALES- please wear underwire-free bra if available       After the Test: . Drink plenty of water. . After receiving IV contrast, you may experience a mild flushed feeling. This is normal. . On occasion, you may experience a mild rash up to 24 hours after the test. This is not dangerous. If this occurs, you can take Benadryl 25 mg and increase your fluid intake. . If you experience trouble breathing, this can be serious. If it is severe call 911 IMMEDIATELY. If it is mild, please call our office. . If you take any of these medications: Glipizide/Metformin, Avandament, Glucavance, please do not take 48 hours after completing test unless otherwise instructed.   Once we have confirmed authorization from your insurance company, we will call you to set up a date and time for your test. Based on how quickly your insurance processes prior authorizations requests, please allow up to 4 weeks to be contacted for scheduling your Cardiac CT appointment. Be advised that routine Cardiac CT appointments could be scheduled as many as 8 weeks after your provider has ordered it.  For non-scheduling related questions, please contact the cardiac imaging nurse navigator should you have any questions/concerns: Marchia Bond, Cardiac Imaging Nurse Navigator Burley Saver, Interim Cardiac Imaging Nurse [place] [facility] Direct Office Dial: [phone]   For scheduling needs, including cancellations and rescheduling, please call Vivien Rota at [phone], option 3.        Signed, Fransico Him, MD  08/19/2020 8:55 AM    [address], [place], [place]  [address] Phone: [phone]; Fax: [phone]

## 2020-08-19 ENCOUNTER — Other Ambulatory Visit: Payer: Self-pay

## 2020-08-19 ENCOUNTER — Ambulatory Visit: Payer: Medicare PPO | Admitting: Cardiology

## 2020-08-19 ENCOUNTER — Encounter: Payer: Self-pay | Admitting: Cardiology

## 2020-08-19 VITALS — BP 120/60 | HR 72 | Ht 61.0 in | Wt 116.0 lb

## 2020-08-19 DIAGNOSIS — E785 Hyperlipidemia, unspecified: Secondary | ICD-10-CM | POA: Diagnosis not present

## 2020-08-19 DIAGNOSIS — R072 Precordial pain: Secondary | ICD-10-CM

## 2020-08-19 DIAGNOSIS — R079 Chest pain, unspecified: Secondary | ICD-10-CM | POA: Diagnosis not present

## 2020-08-19 MED ORDER — METOPROLOL TARTRATE 100 MG PO TABS: ORAL_TABLET | ORAL | 0 refills | Status: DC

## 2020-08-19 NOTE — Patient Instructions (Addendum)
Medication Instructions:  Your physician recommends that you continue on your current medications as directed. Please refer to the Current Medication list given to you today.  *If you need a refill on your cardiac medications before your next appointment, please call your pharmacy*   Testing/Procedures: Your physician has recommended that you have a Coronary CT scan. Please see below for further instructions.    Follow-Up: At [facility], you and your health needs are our priority.  As part of our continuing mission to provide you with exceptional heart care, we have created designated Provider Care Teams.  These Care Teams include your primary Cardiologist (physician) and Advanced Practice Providers (APPs -  Physician Assistants and Nurse Practitioners) who all work together to provide you with the care you need, when you need it.  Follow up with Dr. Radford Pax as needed based on results of testing.    Other Instructions Your cardiac CT will be scheduled at one of the below locations:   [facility] [address] [place], [place] [address] [phone]  Please arrive at the [facility] main entrance of [facility] 30 minutes prior to test start time. Proceed to the [facility] Radiology Department (first floor) to check-in and test prep.  Please follow these instructions carefully (unless otherwise directed):    On the Night Before the Test:  Be sure to Drink plenty of water.  Do not consume any caffeinated/decaffeinated beverages or chocolate 12 hours prior to your test.  Do not take any antihistamines 12 hours prior to your test.  On the Day of the Test:  Drink plenty of water. Do not drink any water within one hour of the test.  Do not eat any food 4 hours prior to the test.  You may take your regular medications prior to the test.   Take metoprolol (Lopressor) two hours prior to test.  FEMALES- please wear underwire-free bra if available        After the Test:  Drink plenty of water.  After receiving IV contrast, you may experience a mild flushed feeling. This is normal.  On occasion, you may experience a mild rash up to 24 hours after the test. This is not dangerous. If this occurs, you can take Benadryl 25 mg and increase your fluid intake.  If you experience trouble breathing, this can be serious. If it is severe call 911 IMMEDIATELY. If it is mild, please call our office.  If you take any of these medications: Glipizide/Metformin, Avandament, Glucavance, please do not take 48 hours after completing test unless otherwise instructed.   Once we have confirmed authorization from your insurance company, we will call you to set up a date and time for your test. Based on how quickly your insurance processes prior authorizations requests, please allow up to 4 weeks to be contacted for scheduling your Cardiac CT appointment. Be advised that routine Cardiac CT appointments could be scheduled as many as 8 weeks after your provider has ordered it.  For non-scheduling related questions, please contact the cardiac imaging nurse navigator should you have any questions/concerns: Marchia Bond, Cardiac Imaging Nurse Navigator Burley Saver, Interim Cardiac Imaging Nurse [place] [facility] Services Direct Office Dial: [phone]   For scheduling needs, including cancellations and rescheduling, please call Vivien Rota at [phone], option 3.

## 2020-08-22 DIAGNOSIS — M542 Cervicalgia: Secondary | ICD-10-CM | POA: Diagnosis not present

## 2020-08-25 DIAGNOSIS — M542 Cervicalgia: Secondary | ICD-10-CM | POA: Diagnosis not present

## 2020-09-02 ENCOUNTER — Other Ambulatory Visit: Payer: Medicare PPO | Admitting: *Deleted

## 2020-09-02 ENCOUNTER — Other Ambulatory Visit: Payer: Self-pay

## 2020-09-02 DIAGNOSIS — R079 Chest pain, unspecified: Secondary | ICD-10-CM

## 2020-09-03 LAB — BASIC METABOLIC PANEL
BUN/Creatinine Ratio: 19 (ref 12–28)
BUN: 15 mg/dL (ref 8–27)
CO2: 27 mmol/L (ref 20–29)
Calcium: 9.5 mg/dL (ref 8.7–10.3)
Chloride: 103 mmol/L (ref 96–106)
Creatinine, Ser: 0.8 mg/dL (ref 0.57–1.00)
GFR calc Af Amer: 86 mL/min/{1.73_m2} (ref >59)
GFR calc non Af Amer: 74 mL/min/{1.73_m2} (ref >59)
Glucose: 171 mg/dL — ABNORMAL HIGH (ref 65–99)
Potassium: 3.8 mmol/L (ref 3.5–5.2)
Sodium: 140 mmol/L (ref 134–144)

## 2020-09-05 ENCOUNTER — Telehealth (HOSPITAL_COMMUNITY): Payer: Self-pay | Admitting: Emergency Medicine

## 2020-09-05 NOTE — Telephone Encounter (Signed)
Pt returning phone call regarding upcoming cardiac imaging study; pt verbalizes understanding of appt date/time, parking situation and where to check in, pre-test NPO status and medications ordered, and verified current allergies; name and call back number provided for further questions should they arise Kenishia Plack RN Navigator Cardiac Imaging [facility] [phone] office [phone] cell   

## 2020-09-05 NOTE — Telephone Encounter (Signed)
Attempted to call patient regarding upcoming cardiac CT appointment. °Left message on voicemail with name and callback number °Herold Salguero RN Navigator Cardiac Imaging °[facility] °[phone] Office °[phone] Cell ° °

## 2020-09-08 ENCOUNTER — Other Ambulatory Visit: Payer: Self-pay

## 2020-09-08 ENCOUNTER — Ambulatory Visit (HOSPITAL_COMMUNITY)
Admission: RE | Admit: 2020-09-08 | Discharge: 2020-09-08 | Disposition: A | Discharge status: Home / Self Care | Payer: Medicare PPO | Source: Ambulatory Visit | Attending: Cardiology | Admitting: Cardiology

## 2020-09-08 DIAGNOSIS — R072 Precordial pain: Secondary | ICD-10-CM | POA: Diagnosis not present

## 2020-09-08 MED ORDER — NITROGLYCERIN 0.4 MG SL SUBL
0.8000 mg | SUBLINGUAL_TABLET | Freq: Once | SUBLINGUAL | Status: AC
  Administered 2020-09-08: 0.8 mg via SUBLINGUAL

## 2020-09-08 MED ORDER — IOHEXOL 350 MG/ML SOLN
80.0000 mL | Freq: Once | INTRAVENOUS | Status: AC | PRN
  Administered 2020-09-08: 80 mL via INTRAVENOUS

## 2020-09-08 MED ORDER — NITROGLYCERIN 0.4 MG SL SUBL
SUBLINGUAL_TABLET | SUBLINGUAL | Status: AC
  Filled 2020-09-08: qty 2

## 2020-09-09 ENCOUNTER — Telehealth: Payer: Self-pay

## 2020-09-09 DIAGNOSIS — E785 Hyperlipidemia, unspecified: Secondary | ICD-10-CM

## 2020-09-09 MED ORDER — PRAVASTATIN SODIUM 40 MG PO TABS: 40.0000 mg | ORAL_TABLET | Freq: Every evening | ORAL | 3 refills | Status: AC

## 2020-09-09 NOTE — Telephone Encounter (Signed)
-----   Message from Audrey Margarita, MD sent at 09/09/2020 11:53 AM EDT ----- Normal coronary CT - she does have atherosclerosis so I would like her LDL < 70.  Increase pravastatin to 40mg  daily and repeat FLP and ALT in 6 weeks. Please find out if she is still having CP and if yes then refer to GI

## 2020-09-09 NOTE — Telephone Encounter (Signed)
The patient has been notified of the result and verbalized understanding.  All questions (if any) were answered. Antonieta Iba, RN 09/09/2020 12:49 PM  Patient will increase pravastatin to 40 mg daily and repeat lab work in 6 weeks.  She states that she has not had any chest pain recently. She states that it occurs at random times and only last about 3-5 minutes. I offered her a referral to GI, she states that she will follow up with her PCP in regards to needing to see GI.

## 2020-09-30 ENCOUNTER — Telehealth: Payer: Self-pay

## 2020-09-30 NOTE — Telephone Encounter (Signed)
   [facility] Pre-operative Risk Assessment    HEARTCARE STAFF: - Please ensure there is not already an duplicate clearance open for this procedure. - Under Visit Info/Reason for Call, type in Other and utilize the format Clearance MM/DD/YY or Clearance TBD. Do not use dashes or single digits. - If request is for dental extraction, please clarify the # of teeth to be extracted.  Request for surgical clearance:  1. What type of surgery is being performed? Tooth Extraction of tooth number 19  2. When is this surgery scheduled? TBD   3. What type of clearance is required (medical clearance vs. Pharmacy clearance to hold med vs. Both)? Both   4. Are there any medications that need to be held prior to surgery and how long? N/A  5. Practice name and name of physician performing surgery? Dr. Jetta Lout   6. What is the office phone number? [phone]   7.   What is the office fax number? [phone]  8.   Anesthesia type (None, local, MAC, general) ? Local Anesthesia containing Epinepherine  Fax from [employer]- patient has made them aware that patient experiences chest pain every few months for a couple of minutes and then it stops. Patient states that cardiology has confirmed its not cardiac related. Dentist office would like to confirm cardiac diagnosis.    Audrey Powers 09/30/2020, 3:19 PM  _________________________________________________________________   (provider comments below)

## 2020-09-30 NOTE — Telephone Encounter (Signed)
   Primary Cardiologist: Fransico Him, MD  Chart reviewed as part of pre-operative protocol coverage.   Simple dental extractions are considered low risk procedures per guidelines and generally do not require any specific cardiac clearance. It is also generally accepted that for simple extractions and dental cleanings, there is no need to interrupt blood thinner therapy.   SBE prophylaxis is not required for the patient from a cardiac standpoint.  I will route this recommendation to the requesting party via Epic fax function and remove from pre-op pool.  Please call with questions.  Ledora Bottcher, PA 09/30/2020, 3:39 PM

## 2020-10-01 NOTE — Telephone Encounter (Signed)
Caryl Pina from [facility] states they received the fax but it did not address verification of the patient's diagnosis and any contractions to epinephrine. They also need to know if her sedation and surgical risk is low medium or high risk.

## 2020-10-01 NOTE — Telephone Encounter (Signed)
° °  Primary Cardiologist: Fransico Him, MD  Chart reviewed as part of pre-operative protocol coverage. Simple dental extractions are considered low risk procedures per guidelines and generally do not require any specific cardiac clearance. It is also generally accepted that for simple extractions and dental cleanings, there is no need to interrupt blood thinner therapy.   Her RCRI is a class I risk, 0.4% risk of major cardiac event.  She is low risk.  From a cardiac standpoint she has no contraindications to use of epinephrine.  SBE prophylaxis is not required for the patient.  I will route this recommendation to the requesting party via Epic fax function and remove from pre-op pool.  Please call with questions.  Deberah Pelton, NP 10/01/2020, 8:39 AM

## 2020-10-21 DIAGNOSIS — M4712 Other spondylosis with myelopathy, cervical region: Secondary | ICD-10-CM | POA: Diagnosis not present

## 2020-10-21 DIAGNOSIS — M48062 Spinal stenosis, lumbar region with neurogenic claudication: Secondary | ICD-10-CM | POA: Diagnosis not present

## 2020-10-27 ENCOUNTER — Other Ambulatory Visit: Payer: Self-pay

## 2020-10-27 ENCOUNTER — Other Ambulatory Visit: Payer: Medicare PPO | Admitting: *Deleted

## 2020-10-27 DIAGNOSIS — E785 Hyperlipidemia, unspecified: Secondary | ICD-10-CM

## 2020-10-27 LAB — ALT: ALT: 16 IU/L (ref 0–32)

## 2020-10-27 LAB — LIPID PANEL
Chol/HDL Ratio: 3 ratio (ref 0.0–4.4)
Cholesterol, Total: 143 mg/dL (ref 100–199)
HDL: 48 mg/dL (ref >39)
LDL Chol Calc (NIH): 76 mg/dL (ref 0–99)
Triglycerides: 105 mg/dL (ref 0–149)
VLDL Cholesterol Cal: 19 mg/dL (ref 5–40)

## 2020-11-10 ENCOUNTER — Other Ambulatory Visit: Payer: Self-pay

## 2020-11-10 ENCOUNTER — Ambulatory Visit
Admission: RE | Admit: 2020-11-10 | Discharge: 2020-11-10 | Disposition: A | Discharge status: Home / Self Care | Payer: Medicare Other | Source: Ambulatory Visit | Attending: Family Medicine | Admitting: Family Medicine

## 2020-11-10 ENCOUNTER — Ambulatory Visit: Payer: Medicare PPO

## 2020-11-10 DIAGNOSIS — Z780 Asymptomatic menopausal state: Secondary | ICD-10-CM | POA: Diagnosis not present

## 2020-11-10 DIAGNOSIS — M810 Age-related osteoporosis without current pathological fracture: Secondary | ICD-10-CM

## 2020-11-10 DIAGNOSIS — M8589 Other specified disorders of bone density and structure, multiple sites: Secondary | ICD-10-CM | POA: Diagnosis not present

## 2020-11-27 DIAGNOSIS — Z6821 Body mass index (BMI) 21.0-21.9, adult: Secondary | ICD-10-CM | POA: Diagnosis not present

## 2020-11-27 DIAGNOSIS — M810 Age-related osteoporosis without current pathological fracture: Secondary | ICD-10-CM | POA: Diagnosis not present

## 2020-12-16 DIAGNOSIS — M542 Cervicalgia: Secondary | ICD-10-CM | POA: Diagnosis not present

## 2020-12-16 DIAGNOSIS — R030 Elevated blood-pressure reading, without diagnosis of hypertension: Secondary | ICD-10-CM | POA: Diagnosis not present

## 2020-12-17 ENCOUNTER — Other Ambulatory Visit: Payer: Self-pay

## 2020-12-17 ENCOUNTER — Ambulatory Visit
Admission: RE | Admit: 2020-12-17 | Discharge: 2020-12-17 | Disposition: A | Discharge status: Home / Self Care | Payer: Medicare PPO | Source: Ambulatory Visit | Attending: Family Medicine | Admitting: Family Medicine

## 2020-12-17 DIAGNOSIS — Z1231 Encounter for screening mammogram for malignant neoplasm of breast: Secondary | ICD-10-CM

## 2021-01-09 DIAGNOSIS — G5603 Carpal tunnel syndrome, bilateral upper limbs: Secondary | ICD-10-CM | POA: Diagnosis not present

## 2021-01-27 HISTORY — PX: CARPAL TUNNEL RELEASE: SHX101

## 2021-02-02 ENCOUNTER — Other Ambulatory Visit: Payer: Self-pay | Admitting: Neurosurgery

## 2021-02-02 ENCOUNTER — Telehealth: Payer: Self-pay

## 2021-02-02 DIAGNOSIS — M542 Cervicalgia: Secondary | ICD-10-CM

## 2021-02-02 NOTE — Telephone Encounter (Signed)
Phone call to patient to verify medication list and allergies for myelogram procedure. Pt instructed to hold Maxalt for 48hrs prior to myelogram appointment time and 24 hours after appointment. Pt also instructed to have a driver the day of the procedure, the procedure would take around 2 hours, and discharge instructions discussed. Pt verbalized understanding.

## 2021-02-05 DIAGNOSIS — G5602 Carpal tunnel syndrome, left upper limb: Secondary | ICD-10-CM | POA: Diagnosis not present

## 2021-02-10 ENCOUNTER — Other Ambulatory Visit: Payer: Medicare PPO

## 2021-02-17 DIAGNOSIS — G5602 Carpal tunnel syndrome, left upper limb: Secondary | ICD-10-CM | POA: Diagnosis not present

## 2021-02-17 DIAGNOSIS — Z4789 Encounter for other orthopedic aftercare: Secondary | ICD-10-CM | POA: Diagnosis not present

## 2021-02-20 ENCOUNTER — Ambulatory Visit
Admission: RE | Admit: 2021-02-20 | Discharge: 2021-02-20 | Disposition: A | Discharge status: Home / Self Care | Payer: Medicare PPO | Source: Ambulatory Visit | Attending: Neurosurgery | Admitting: Neurosurgery

## 2021-02-20 ENCOUNTER — Other Ambulatory Visit: Payer: Self-pay

## 2021-02-20 DIAGNOSIS — M4322 Fusion of spine, cervical region: Secondary | ICD-10-CM | POA: Diagnosis not present

## 2021-02-20 DIAGNOSIS — M5030 Other cervical disc degeneration, unspecified cervical region: Secondary | ICD-10-CM | POA: Diagnosis not present

## 2021-02-20 DIAGNOSIS — M542 Cervicalgia: Secondary | ICD-10-CM

## 2021-02-20 MED ORDER — DIAZEPAM 5 MG PO TABS
5.0000 mg | ORAL_TABLET | Freq: Once | ORAL | Status: AC
  Administered 2021-02-20: 5 mg via ORAL

## 2021-02-20 MED ORDER — IOPAMIDOL (ISOVUE-M 300) INJECTION 61%
10.0000 mL | Freq: Once | INTRAMUSCULAR | Status: AC | PRN
  Administered 2021-02-20: 10 mL via INTRATHECAL

## 2021-02-20 NOTE — Progress Notes (Signed)
Pt reports she has been off of her Maxalt for at least 48 hours.

## 2021-02-20 NOTE — Discharge Instructions (Signed)
Myelogram Discharge Instructions  1. Go home and rest quietly for the next 24 hours.  It is important to lie flat for the next 24 hours.  Get up only to go to the restroom.  You may lie in the bed or on a couch on your back, your stomach, your left side or your right side.  You may have one pillow under your head.  You may have pillows between your knees while you are on your side or under your knees while you are on your back.  2. DO NOT drive today.  Recline the seat as far back as it will go, while still wearing your seat belt, on the way home.  3. You may get up to go to the bathroom as needed.  You may sit up for 10 minutes to eat.  You may resume your normal diet and medications unless otherwise indicated.  Drink lots of extra fluids today and tomorrow.  4. The incidence of headache, nausea, or vomiting is about 5% (one in 20 patients).  If you develop a headache, lie flat and drink plenty of fluids until the headache goes away.  Caffeinated beverages may be helpful.  If you develop severe nausea and vomiting or a headache that does not go away with flat bed rest, call [phone].  5. You may resume normal activities after your 24 hours of bed rest is over; however, do not exert yourself strongly or do any heavy lifting tomorrow. If when you get up you have a headache when standing, go back to bed and force fluids for another 24 hours.  6. Call your physician for a follow-up appointment.  The results of your myelogram will be sent directly to your physician by the following day.  7. If you have any questions or if complications develop after you arrive home, please call [phone].  Discharge instructions have been explained to the patient.  The patient, or the person responsible for the patient, fully understands these instructions   YOU MAY TAKE YOUR MAXALT TOMORROW ON 02/21/21 @ 1PM OR THEREAFTER.

## 2021-03-03 DIAGNOSIS — M542 Cervicalgia: Secondary | ICD-10-CM | POA: Diagnosis not present

## 2021-03-03 DIAGNOSIS — M4712 Other spondylosis with myelopathy, cervical region: Secondary | ICD-10-CM | POA: Diagnosis not present

## 2021-03-05 DIAGNOSIS — M4712 Other spondylosis with myelopathy, cervical region: Secondary | ICD-10-CM | POA: Diagnosis not present

## 2021-03-05 DIAGNOSIS — R194 Change in bowel habit: Secondary | ICD-10-CM | POA: Diagnosis not present

## 2021-03-05 DIAGNOSIS — M542 Cervicalgia: Secondary | ICD-10-CM | POA: Diagnosis not present

## 2021-03-12 DIAGNOSIS — M4712 Other spondylosis with myelopathy, cervical region: Secondary | ICD-10-CM | POA: Diagnosis not present

## 2021-03-12 DIAGNOSIS — M542 Cervicalgia: Secondary | ICD-10-CM | POA: Diagnosis not present

## 2021-03-17 DIAGNOSIS — M4712 Other spondylosis with myelopathy, cervical region: Secondary | ICD-10-CM | POA: Diagnosis not present

## 2021-03-17 DIAGNOSIS — M542 Cervicalgia: Secondary | ICD-10-CM | POA: Diagnosis not present

## 2021-03-19 DIAGNOSIS — M4712 Other spondylosis with myelopathy, cervical region: Secondary | ICD-10-CM | POA: Diagnosis not present

## 2021-03-19 DIAGNOSIS — M542 Cervicalgia: Secondary | ICD-10-CM | POA: Diagnosis not present

## 2021-03-24 DIAGNOSIS — M4712 Other spondylosis with myelopathy, cervical region: Secondary | ICD-10-CM | POA: Diagnosis not present

## 2021-03-24 DIAGNOSIS — M542 Cervicalgia: Secondary | ICD-10-CM | POA: Diagnosis not present

## 2021-03-27 DIAGNOSIS — M4712 Other spondylosis with myelopathy, cervical region: Secondary | ICD-10-CM | POA: Diagnosis not present

## 2021-03-27 DIAGNOSIS — M542 Cervicalgia: Secondary | ICD-10-CM | POA: Diagnosis not present

## 2021-03-31 DIAGNOSIS — M4712 Other spondylosis with myelopathy, cervical region: Secondary | ICD-10-CM | POA: Diagnosis not present

## 2021-03-31 DIAGNOSIS — M542 Cervicalgia: Secondary | ICD-10-CM | POA: Diagnosis not present

## 2021-04-01 DIAGNOSIS — L813 Cafe au lait spots: Secondary | ICD-10-CM | POA: Diagnosis not present

## 2021-04-01 DIAGNOSIS — D1801 Hemangioma of skin and subcutaneous tissue: Secondary | ICD-10-CM | POA: Diagnosis not present

## 2021-04-01 DIAGNOSIS — L814 Other melanin hyperpigmentation: Secondary | ICD-10-CM | POA: Diagnosis not present

## 2021-04-01 DIAGNOSIS — L821 Other seborrheic keratosis: Secondary | ICD-10-CM | POA: Diagnosis not present

## 2021-04-01 DIAGNOSIS — Q825 Congenital non-neoplastic nevus: Secondary | ICD-10-CM | POA: Diagnosis not present

## 2021-04-02 DIAGNOSIS — M4712 Other spondylosis with myelopathy, cervical region: Secondary | ICD-10-CM | POA: Diagnosis not present

## 2021-04-02 DIAGNOSIS — M542 Cervicalgia: Secondary | ICD-10-CM | POA: Diagnosis not present

## 2021-04-07 DIAGNOSIS — M542 Cervicalgia: Secondary | ICD-10-CM | POA: Diagnosis not present

## 2021-04-07 DIAGNOSIS — M4712 Other spondylosis with myelopathy, cervical region: Secondary | ICD-10-CM | POA: Diagnosis not present

## 2021-04-09 DIAGNOSIS — M4712 Other spondylosis with myelopathy, cervical region: Secondary | ICD-10-CM | POA: Diagnosis not present

## 2021-04-09 DIAGNOSIS — M542 Cervicalgia: Secondary | ICD-10-CM | POA: Diagnosis not present

## 2021-04-14 DIAGNOSIS — M4712 Other spondylosis with myelopathy, cervical region: Secondary | ICD-10-CM | POA: Diagnosis not present

## 2021-04-14 DIAGNOSIS — M542 Cervicalgia: Secondary | ICD-10-CM | POA: Diagnosis not present

## 2021-04-16 DIAGNOSIS — M4712 Other spondylosis with myelopathy, cervical region: Secondary | ICD-10-CM | POA: Diagnosis not present

## 2021-04-16 DIAGNOSIS — M542 Cervicalgia: Secondary | ICD-10-CM | POA: Diagnosis not present

## 2021-04-21 DIAGNOSIS — M4712 Other spondylosis with myelopathy, cervical region: Secondary | ICD-10-CM | POA: Diagnosis not present

## 2021-04-21 DIAGNOSIS — M542 Cervicalgia: Secondary | ICD-10-CM | POA: Diagnosis not present

## 2021-04-23 DIAGNOSIS — M542 Cervicalgia: Secondary | ICD-10-CM | POA: Diagnosis not present

## 2021-04-23 DIAGNOSIS — M4712 Other spondylosis with myelopathy, cervical region: Secondary | ICD-10-CM | POA: Diagnosis not present

## 2021-04-28 DIAGNOSIS — M4712 Other spondylosis with myelopathy, cervical region: Secondary | ICD-10-CM | POA: Diagnosis not present

## 2021-04-28 DIAGNOSIS — M542 Cervicalgia: Secondary | ICD-10-CM | POA: Diagnosis not present

## 2021-04-30 DIAGNOSIS — M542 Cervicalgia: Secondary | ICD-10-CM | POA: Diagnosis not present

## 2021-04-30 DIAGNOSIS — M4712 Other spondylosis with myelopathy, cervical region: Secondary | ICD-10-CM | POA: Diagnosis not present

## 2021-05-05 DIAGNOSIS — M542 Cervicalgia: Secondary | ICD-10-CM | POA: Diagnosis not present

## 2021-05-05 DIAGNOSIS — M4712 Other spondylosis with myelopathy, cervical region: Secondary | ICD-10-CM | POA: Diagnosis not present

## 2021-05-07 DIAGNOSIS — M542 Cervicalgia: Secondary | ICD-10-CM | POA: Diagnosis not present

## 2021-05-07 DIAGNOSIS — M4712 Other spondylosis with myelopathy, cervical region: Secondary | ICD-10-CM | POA: Diagnosis not present

## 2021-05-12 ENCOUNTER — Ambulatory Visit: Payer: Medicare PPO | Admitting: Internal Medicine

## 2021-05-12 ENCOUNTER — Encounter: Payer: Self-pay | Admitting: Internal Medicine

## 2021-05-12 VITALS — BP 110/60 | HR 79 | Ht 61.0 in | Wt 109.0 lb

## 2021-05-12 DIAGNOSIS — R194 Change in bowel habit: Secondary | ICD-10-CM | POA: Diagnosis not present

## 2021-05-12 NOTE — Patient Instructions (Signed)
You have been scheduled for a colonoscopy. Please follow written instructions given to you at your visit today.  Please pick up your prep supplies at the pharmacy within the next 1-3 days. If you use inhalers (even only as needed), please bring them with you on the day of your procedure.   Due to recent changes in healthcare laws, you may see the results of your imaging and laboratory studies on MyChart before your provider has had a chance to review them.  We understand that in some cases there may be results that are confusing or concerning to you. Not all laboratory results come back in the same time frame and the provider may be waiting for multiple results in order to interpret others.  Please give us 48 hours in order for your provider to thoroughly review all the results before contacting the office for clarification of your results.    I appreciate the opportunity to care for you. Carl Gessner, MD, FACG 

## 2021-05-12 NOTE — Progress Notes (Signed)
Audrey Powers 70 y.o. 1951/11/11 [id number]  Assessment & Plan:   Encounter Diagnosis  Name Primary?  . Change in bowel habits Yes    This is most likely an IBS phenomena.  Perhaps she has diverticulosis.  That was not seen in the past but certainly can be there.  Given the time Since last colonoscopy in 2015 I have recommended and she has agreed to pursue a colonoscopy.  The risks and benefits as well as alternatives of endoscopic procedure(s) have been discussed and reviewed. All questions answered. The patient agrees to proceed.   MiraLAX prep    Subjective:   Chief Complaint: Change in bowel habits  HPI The patient is a 70 year old woman known to me with hemorrhoid issues and a negative colonoscopy in 2015, who has been having some changes in bowel habits.  She was seen in 2019 last see notes below in bold.  She is having changing in bowel habits, she wonders if it was related to turmeric which was started in December that could cause diarrhea and she has stopped that recently but is swinging back and forth between hard balls of stool and then thin loose stools.  She had been constipated prior to all of this and took Benefiber improvements.  She has stopped those because she has not been constipated but again is having these alternating or mixed habits. No bleeding.  No persistent change in defecation.  She saw Dr. Dema Severin who suggested she return to me.  Wt Readings from Last 3 Encounters:  05/12/21 109 lb (49.4 kg)  08/19/20 116 lb (52.6 kg)  08/22/19 112 lb 6.4 oz (51 kg)    There has been some weight loss as can be seen, she started with braces on her teeth in November.  She had had an extraction of a tooth but because of Prolia history and concerns about jaw changes etc. she could not have an implant she stopped the Prolia but the braces are to move her teeth and help fill in the defect in the jawline where she had an extraction.    Last seen 9/19     The patient is here with complaints of hemorrhoids.  She has problems with irritation itching and swelling and believes it is related to hemorrhoids.  The problems began this spring she saw Dr. Marcello Moores of surgery and she took Metamucil faithfully at a tablespoon a day trying to increase up to 3 times a day.  Her bowel habits improved, she has been having little small hard balls of stool, with some straining to stool, but she could not tolerate the Metamucil so she stopped.  She does think she eats a good high-fiber diet and tries to eat a lot of vegetables.  There is not any rectal pain or bleeding.  Last colonoscopy 2015 really normal.  Dr. Marcello Moores thought conservative measures made sense.  The patient does agree with that.  She has used some hydrocortisone cream with relief at times.    NL colonoscopy 2015  Allergies  Allergen Reactions  . Codeine Nausea And Vomiting  . Erythromycin Nausea Only  . Sumatriptan Other (See Comments)    Not effective  . Topamax [Topiramate] Other (See Comments)    dizzy  . Tramadol Nausea And Vomiting    Headaches, dizziness  . Hydrocodone Nausea Only    dizziness   Current Meds  Medication Sig  . ALPRAZolam (XANAX) 0.25 MG tablet Take 0.25 mg by mouth 2 (two) times daily as needed  for anxiety.   Marland Kitchen b complex vitamins tablet Take 1 tablet by mouth every other day.   . Biotin 1000 MCG tablet Take 1,000 mcg by mouth daily.  . cyclobenzaprine (FLEXERIL) 5 MG tablet Take 5 mg by mouth 3 (three) times daily as needed for muscle spasms.   . diclofenac sodium (VOLTAREN) 1 % GEL Apply 1 application topically 3 (three) times daily as needed (pain).   Marland Kitchen dicyclomine (BENTYL) 10 MG capsule Take 10 mg by mouth 3 (three) times daily as needed for spasms.   . famotidine (PEPCID) 40 MG tablet Take 40 mg by mouth daily as needed for heartburn.   . gabapentin (NEURONTIN) 600 MG tablet Take 600 mg by mouth 3 (three) times daily.  . Multiple Vitamin (MULTIVITAMIN) tablet  Take 1 tablet by mouth daily.  . naproxen (NAPROSYN) 500 MG tablet Take 500 mg by mouth 2 (two) times daily as needed for moderate pain.   Marland Kitchen ondansetron (ZOFRAN) 4 MG tablet Take 4 mg by mouth every 8 (eight) hours as needed for nausea or vomiting.   . pravastatin (PRAVACHOL) 40 MG tablet Take 1 tablet (40 mg total) by mouth every evening.  . prednisoLONE acetate (PRED FORTE) 1 % ophthalmic suspension Place 1 drop into the right eye 2 (two) times daily as needed (DRY EYE).  . rizatriptan (MAXALT) 10 MG tablet Take 10 mg by mouth as needed for migraine. May repeat in 2 hours if needed   Past Medical History:  Diagnosis Date  . Anxiety   . Arthritis   . Bunion   . DDD (degenerative disc disease), cervical   . Degenerative scoliosis in adult patient 08/22/2019  . Depression   . Endometrial polyp   . Fibrocystic breast changes   . Fundic gland polyps of stomach, benign 08/2016  . GERD (gastroesophageal reflux disease)   . Headache, migraine   . Hirsutism   . Hyperlipidemia   . IBS (irritable bowel syndrome)   . Internal and external prolapsed hemorrhoids 09/04/2018  . Iron deficiency anemia   . Osteoarthritis   . Osteoporosis   . PONV (postoperative nausea and vomiting)   . Tinnitus    Past Surgical History:  Procedure Laterality Date  . CARPAL TUNNEL RELEASE Left 01/2021  . CERVICAL FUSION  05/2020  . COLONOSCOPY    . DILATION AND CURETTAGE OF UTERUS  1995   x 2  . EUS    . HYSTEROSCOPY    . LUMBAR FUSION  07/2019   and disc replacement  . TUBAL LIGATION  1989  . 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   Review of Systems As per HPI  Objective:   Physical Exam BP 110/60   Pulse 79   Ht 5\' 1"  (1.549 m) Comment: height measured without shoes  Wt 109 lb (49.4 kg)   BMI 20.60 kg/m  NAD thin ww Lungs cta Cor NL abd soft NT BS +  Data reviewed see above.  Normal hematocrit March 05, 2021.  Normal LFTs that day as well.  Calcium normal as well.

## 2021-05-19 DIAGNOSIS — H5213 Myopia, bilateral: Secondary | ICD-10-CM | POA: Diagnosis not present

## 2021-05-19 DIAGNOSIS — H2513 Age-related nuclear cataract, bilateral: Secondary | ICD-10-CM | POA: Diagnosis not present

## 2021-05-21 DIAGNOSIS — M542 Cervicalgia: Secondary | ICD-10-CM | POA: Diagnosis not present

## 2021-05-21 DIAGNOSIS — M4712 Other spondylosis with myelopathy, cervical region: Secondary | ICD-10-CM | POA: Diagnosis not present

## 2021-05-28 DIAGNOSIS — M810 Age-related osteoporosis without current pathological fracture: Secondary | ICD-10-CM | POA: Diagnosis not present

## 2021-06-02 DIAGNOSIS — M4712 Other spondylosis with myelopathy, cervical region: Secondary | ICD-10-CM | POA: Diagnosis not present

## 2021-06-02 DIAGNOSIS — M542 Cervicalgia: Secondary | ICD-10-CM | POA: Diagnosis not present

## 2021-06-04 DIAGNOSIS — M4712 Other spondylosis with myelopathy, cervical region: Secondary | ICD-10-CM | POA: Diagnosis not present

## 2021-06-04 DIAGNOSIS — M542 Cervicalgia: Secondary | ICD-10-CM | POA: Diagnosis not present

## 2021-06-04 DIAGNOSIS — M810 Age-related osteoporosis without current pathological fracture: Secondary | ICD-10-CM | POA: Diagnosis not present

## 2021-06-04 DIAGNOSIS — M7918 Myalgia, other site: Secondary | ICD-10-CM | POA: Diagnosis not present

## 2021-06-04 DIAGNOSIS — Z6821 Body mass index (BMI) 21.0-21.9, adult: Secondary | ICD-10-CM | POA: Diagnosis not present

## 2021-06-09 ENCOUNTER — Other Ambulatory Visit: Payer: Self-pay

## 2021-06-09 ENCOUNTER — Ambulatory Visit (AMBULATORY_SURGERY_CENTER): Payer: Medicare PPO | Admitting: Internal Medicine

## 2021-06-09 ENCOUNTER — Encounter: Payer: Self-pay | Admitting: Internal Medicine

## 2021-06-09 VITALS — BP 110/53 | HR 68 | Temp 97.3°F | Resp 18 | Ht 61.0 in | Wt 109.0 lb

## 2021-06-09 DIAGNOSIS — K219 Gastro-esophageal reflux disease without esophagitis: Secondary | ICD-10-CM | POA: Diagnosis not present

## 2021-06-09 DIAGNOSIS — R194 Change in bowel habit: Secondary | ICD-10-CM

## 2021-06-09 MED ORDER — SODIUM CHLORIDE 0.9 % IV SOLN: 500.0000 mL | Freq: Once | INTRAVENOUS | Status: DC

## 2021-06-09 NOTE — Op Note (Signed)
[facility] Patient Name: Audrey Powers Procedure Date: 06/09/2021 7:35 AM MRN: [id number] Endoscopist: Gatha Mayer , MD Age: 70 Referring MD:  Date of Birth: 24-Aug-1951 Gender: Female Account #: [id number] Procedure:                Colonoscopy Indications:              Change in bowel habits Medicines:                Propofol per Anesthesia, Monitored Anesthesia Care Procedure:                Pre-Anesthesia Assessment:                           - Prior to the procedure, a History and Physical                            was performed, and patient medications and                            allergies were reviewed. The patient's tolerance of                            previous anesthesia was also reviewed. The risks                            and benefits of the procedure and the sedation                            options and risks were discussed with the patient.                            All questions were answered, and informed consent                            was obtained. Prior Anticoagulants: The patient has                            taken no previous anticoagulant or antiplatelet                            agents. ASA Grade Assessment: II - A patient with                            mild systemic disease. After reviewing the risks                            and benefits, the patient was deemed in                            satisfactory condition to undergo the procedure.                           After obtaining informed consent, the colonoscope  was passed under direct vision. Throughout the                            procedure, the patient's blood pressure, pulse, and                            oxygen saturations were monitored continuously. The                            Olympus PCF-H190DL (#[id number]) Colonoscope was                            introduced through the anus and advanced to the the                            cecum,  identified by appendiceal orifice and                            ileocecal valve. The colonoscopy was somewhat                            difficult due to significant looping. Successful                            completion of the procedure was aided by applying                            abdominal pressure. The patient tolerated the                            procedure well. The quality of the bowel                            preparation was excellent. The bowel preparation                            used was Miralax via split dose instruction. The                            ileocecal valve, appendiceal orifice, and rectum                            were photographed. Scope In: 7:42:40 AM Scope Out: 7:53:25 AM Scope Withdrawal Time: 0 hours 6 minutes 40 seconds  Total Procedure Duration: 0 hours 10 minutes 45 seconds  Findings:                 The perianal and digital rectal examinations were                            normal.                           The entire examined colon appeared normal on direct  and retroflexion views. Complications:            No immediate complications. Estimated Blood Loss:     Estimated blood loss: none. Impression:               - The entire examined colon is normal on direct and                            retroflexion views.                           - No specimens collected. Recommendation:           - Patient has a contact number available for                            emergencies. The signs and symptoms of potential                            delayed complications were discussed with the                            patient. Return to normal activities tomorrow.                            Written discharge instructions were provided to the                            patient.                           - High fiber diet. Fiber supplementation is an                            option                           - Continue present  medications.                           - No routine repeat colonoscopy due to current age                            (55 years or older) and the absence of colonic                            polyps. Gatha Mayer, MD 06/09/2021 8:00:56 AM This report has been signed electronically.

## 2021-06-09 NOTE — Patient Instructions (Addendum)
No problems seen - diet, medications? As cause.  I think high fiber diet +/- fiber supplements make sense if the bowel changes continue to bother you.   I appreciate the opportunity to care for you. Gatha Mayer, MD, FACG YOU HAD AN ENDOSCOPIC PROCEDURE TODAY AT [facility]:   Refer to the procedure report that was given to you for any specific questions about what was found during the examination.  If the procedure report does not answer your questions, please call your gastroenterologist to clarify.  If you requested that your care partner not be given the details of your procedure findings, then the procedure report has been included in a sealed envelope for you to review at your convenience later.  YOU SHOULD EXPECT: Some feelings of bloating in the abdomen. Passage of more gas than usual.  Walking can help get rid of the air that was put into your GI tract during the procedure and reduce the bloating. If you had a lower endoscopy (such as a colonoscopy or flexible sigmoidoscopy) you may notice spotting of blood in your stool or on the toilet paper. If you underwent a bowel prep for your procedure, you may not have a normal bowel movement for a few days.  Please Note:  You might notice some irritation and congestion in your nose or some drainage.  This is from the oxygen used during your procedure.  There is no need for concern and it should clear up in a day or so.  SYMPTOMS TO REPORT IMMEDIATELY:  Following lower endoscopy (colonoscopy or flexible sigmoidoscopy):  Excessive amounts of blood in the stool  Significant tenderness or worsening of abdominal pains  Swelling of the abdomen that is new, acute  Fever of 100F or higher   For urgent or emergent issues, a gastroenterologist can be reached at any hour by calling [phone]. Do not use MyChart messaging for urgent concerns.    DIET:  We do recommend a small meal at first, but then you may proceed to your  regular diet.  Drink plenty of fluids but you should avoid alcoholic beverages for 24 hours.  ACTIVITY:  You should plan to take it easy for the rest of today and you should NOT DRIVE or use heavy machinery until tomorrow (because of the sedation medicines used during the test).    FOLLOW UP: Our staff will call the number listed on your records 48-72 hours following your procedure to check on you and address any questions or concerns that you may have regarding the information given to you following your procedure. If we do not reach you, we will leave a message.  We will attempt to reach you two times.  During this call, we will ask if you have developed any symptoms of COVID 19. If you develop any symptoms (ie: fever, flu-like symptoms, shortness of breath, cough etc.) before then, please call [phone].  If you test positive for Covid 19 in the 2 weeks post procedure, please call and report this information to [place].    If any biopsies were taken you will be contacted by phone or by letter within the next 1-3 weeks.  Please call us at [phone] if you have not heard about the biopsies in 3 weeks.    SIGNATURES/CONFIDENTIALITY: You and/or your care partner have signed paperwork which will be entered into your electronic medical record.  These signatures attest to the fact that that the information above on your After Visit  Summary has been reviewed and is understood.  Full responsibility of the confidentiality of this discharge information lies with you and/or your care-partner.    Resume medications.see recommendations above.

## 2021-06-09 NOTE — Progress Notes (Signed)
Vitals-CW  Pt's states no medical or surgical changes since previsit or office visit. 

## 2021-06-09 NOTE — Progress Notes (Signed)
Report to PACU, RN, vss, BBS= Clear.  

## 2021-06-11 ENCOUNTER — Telehealth: Payer: Self-pay | Admitting: *Deleted

## 2021-06-11 NOTE — Telephone Encounter (Signed)
  Follow up Call-  Call back number 06/09/2021 02/20/2021 07/11/2019  Post procedure Call Back phone  # [phone]  Permission to leave phone message Yes - -  Some recent data might be hidden     Patient questions:  Do you have a fever, pain , or abdominal swelling? No. Pain Score  0 *  Have you tolerated food without any problems? Yes.    Have you been able to return to your normal activities? Yes.    Do you have any questions about your discharge instructions: Diet   No. Medications  No. Follow up visit  No.  Do you have questions or concerns about your Care? No.  Actions: * If pain score is 4 or above: No action needed, pain <4.  Have you developed a fever since your procedure? no  2.   Have you had an respiratory symptoms (SOB or cough) since your procedure? no  3.   Have you tested positive for COVID 19 since your procedure no  4.   Have you had any family members/close contacts diagnosed with the COVID 19 since your procedure?  no   If yes to any of these questions please route to Joylene John, RN and Joella Prince, RN

## 2021-06-17 DIAGNOSIS — M5090 Cervical disc disorder, unspecified, unspecified cervical region: Secondary | ICD-10-CM | POA: Diagnosis not present

## 2021-06-17 DIAGNOSIS — M542 Cervicalgia: Secondary | ICD-10-CM | POA: Diagnosis not present

## 2021-06-25 IMAGING — XA DG MYELOGRAPHY LUMBAR INJ LUMBOSACRAL
13 of 18 series · 13 of 18 positions shown · non-contrast
Comparison: MRI lumbar spine dated December 15, 2018.

CLINICAL DATA: Chronic right-sided low back pain radiating into the
right buttock and down the right leg to the foot. No prior surgery.
TECHNIQUE: Contiguous axial images were obtained through the lumbar spine after
the intrathecal infusion of contrast. Coronal and sagittal
reconstructions were obtained of the axial image sets.

[Series 1: w lumbar spine lat · 0.15mm/px · 1 of 1 slices shown]
[im 1/1]
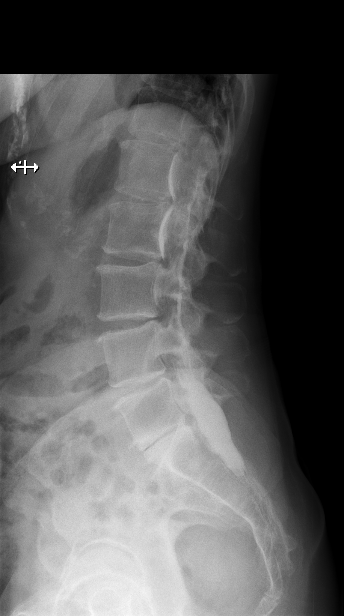

[Series 2: vasc standard · 1 of 1 slices shown (1 of 11)]
[im 1/1]
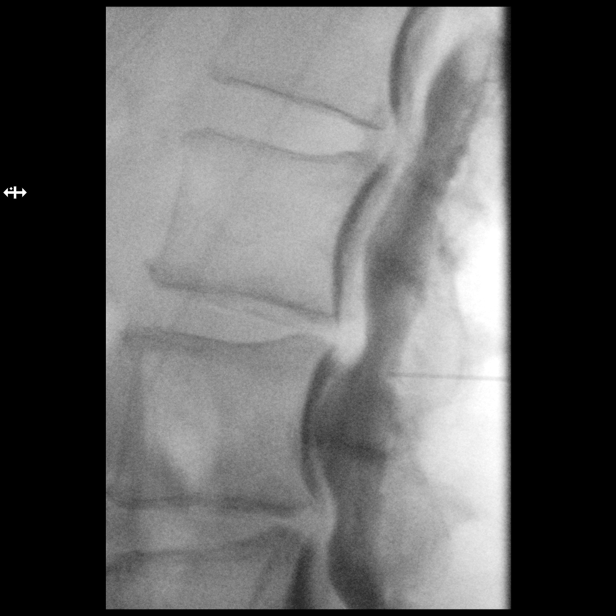

[Series 2: w lumbar spine flexion · 0.15mm/px · 1 of 1 slices shown]
[im 1/1]
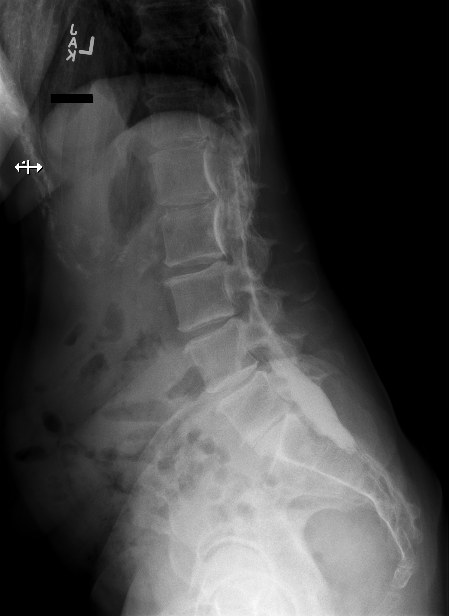

[Series 3: vasc standard · 1 of 1 slices shown (2 of 11)]
[im 1/1]
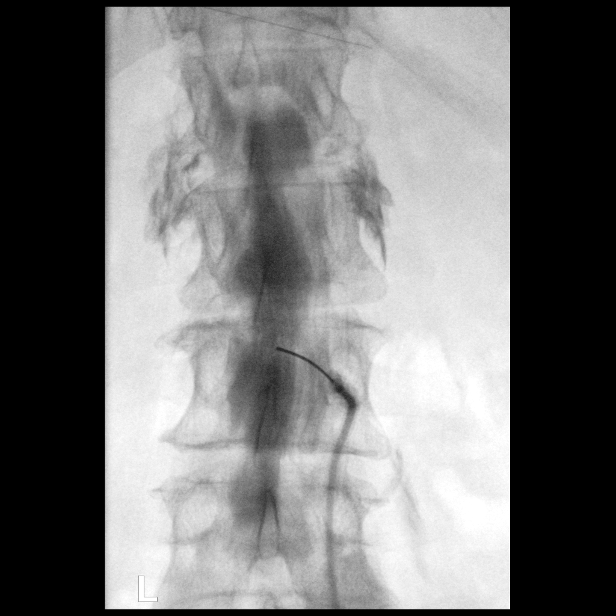

[Series 4: vasc standard · 1 of 1 slices shown (3 of 11)]
[im 1/1]
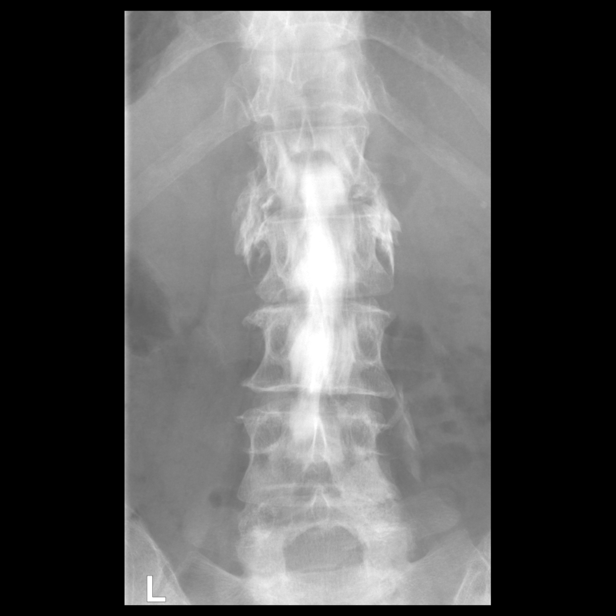

[Series 5: vasc standard · 1 of 1 slices shown (4 of 11)]
[im 1/1]
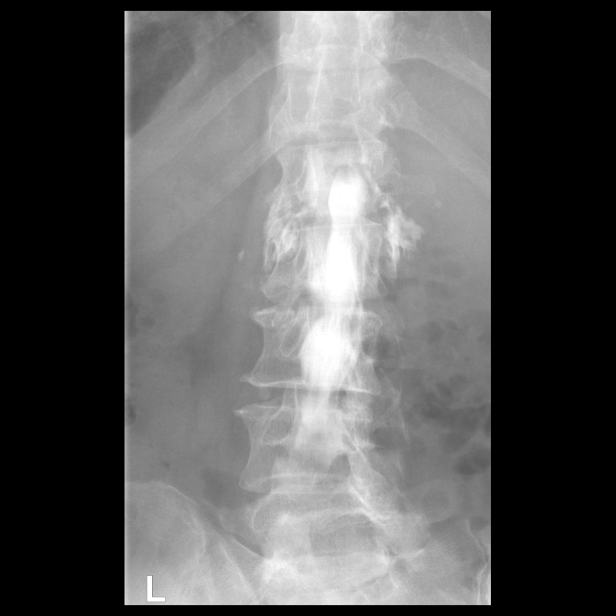

[Series 7: vasc standard · 1 of 1 slices shown (5 of 11)]
[im 1/1]
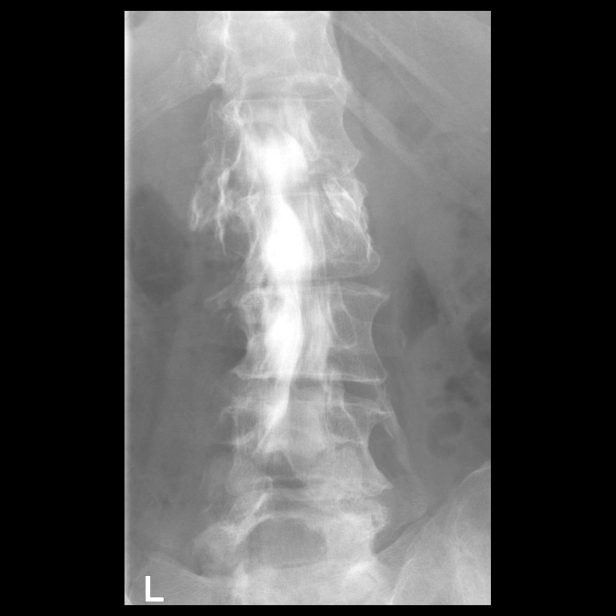

[Series 8: vasc standard · 1 of 1 slices shown (6 of 11)]
[im 1/1]
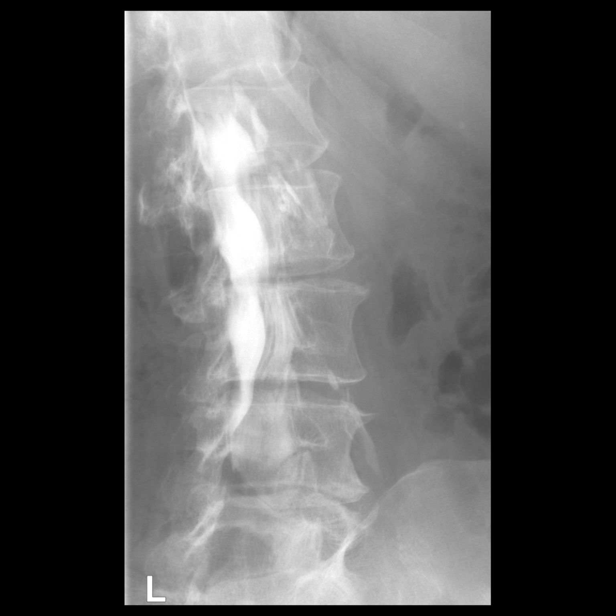

[Series 9: vasc standard · 1 of 1 slices shown (7 of 11)]
[im 1/1]
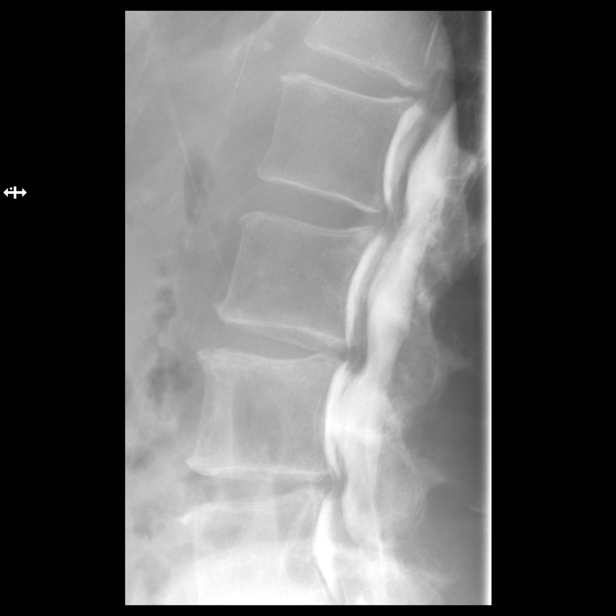

[Series 11: vasc standard · 1 of 1 slices shown (8 of 11)]
[im 1/1]
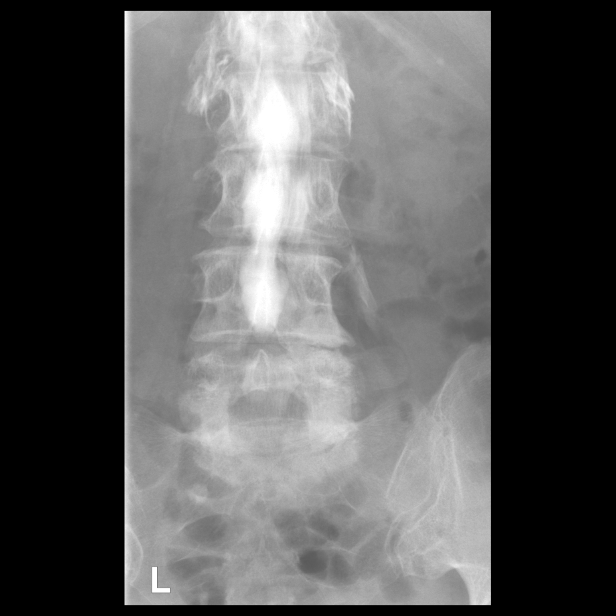

[Series 12: vasc standard · 1 of 1 slices shown (9 of 11)]
[im 1/1]
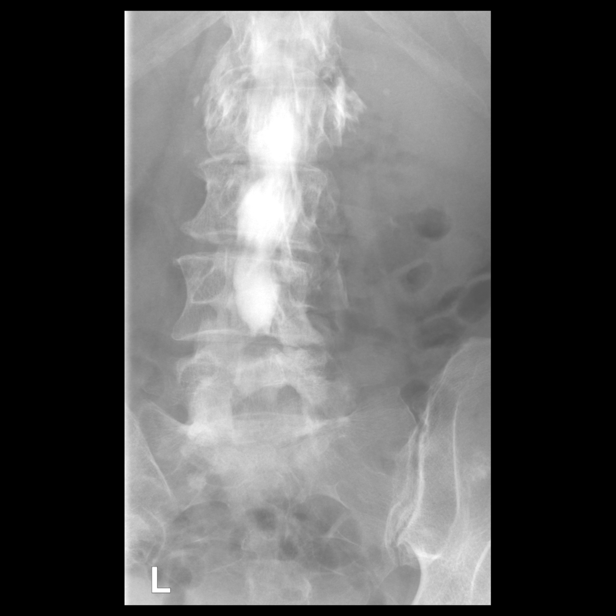

[Series 13: vasc standard · 1 of 1 slices shown (10 of 11)]
[im 1/1]
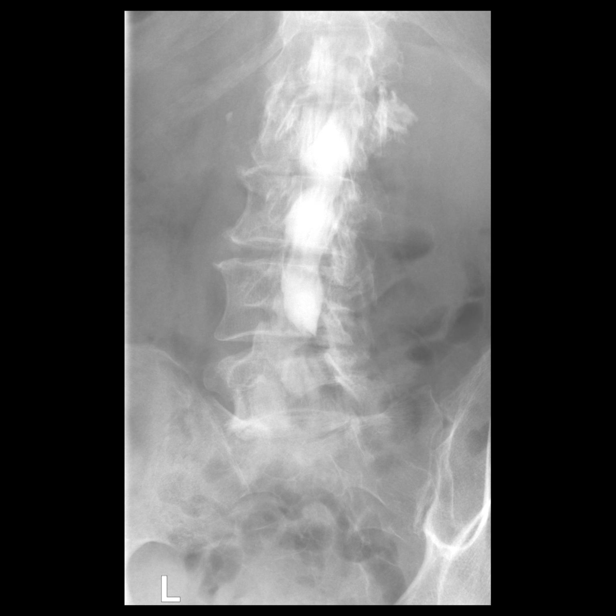

[Series 15: vasc standard · 1 of 1 slices shown (11 of 11)]
[im 1/1]
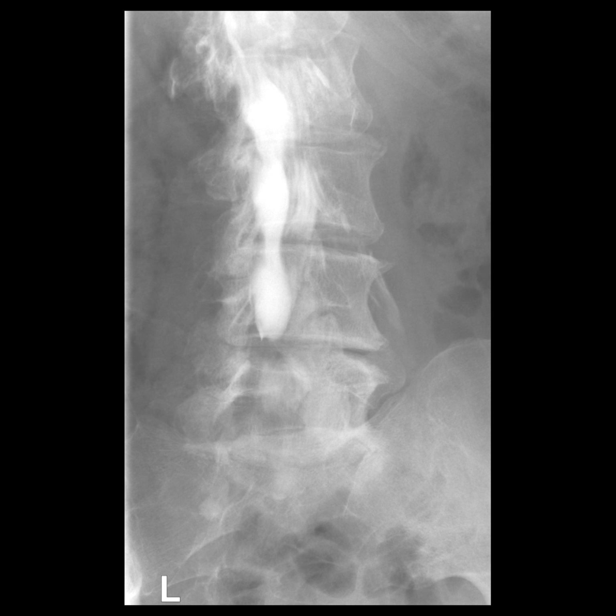

[13 of 18 positions shown; findings below may reference images not displayed]

EXAM:
LUMBAR MYELOGRAM

CT LUMBAR MYELOGRAM

FLUOROSCOPY TIME:  Radiation Exposure Index (as provided by the
fluoroscopic device): 10.7 mGy

Fluoroscopy Time:  22 seconds

Number of Acquired Images:  15

PROCEDURE:
After thorough discussion of risks and benefits of the procedure
including bleeding, infection, injury to nerves, blood vessels,
adjacent structures as well as headache and CSF leak, written and
oral informed consent was obtained. Consent was obtained by Dr.
Audel Laura. Time out form was completed.

Patient was positioned prone on the fluoroscopy table. Local
anesthesia was provided with 1% lidocaine without epinephrine after
prepped and draped in the usual sterile fashion. Puncture was
performed at L2-L3 using a 3 1/2 inch 22-gauge spinal needle via
right interlaminar approach. Using a single pass through the dura,
the needle was placed within the thecal sac, with return of clear
CSF. Initial injection of Isovue O-JHH demonstrated predominantly
epidural opacification. The needle was repositioned and repeat
injection demonstrated subdural placement. The needle was again
repositioned and contrast injection finally demonstrated normal
opacification of the nerve roots and cauda equina consistent with
free flow within the subarachnoid space. Total injection volume was
15 mL of contrast.

I personally performed the lumbar puncture and administered the
intrathecal contrast. I also personally supervised acquisition of
the myelogram images.
FINDINGS: LUMBAR MYELOGRAM FINDINGS:

Normal alignment. No dynamic instability. Small ventral extradural
defects from T12-L1 through L5-S1 when prone, not significantly
changed when standing. Mild spinal canal stenosis at L3-L4 and
L4-L5. Underfilling of the right L4 and L5 nerve roots, as well as
the left L5 and S1 nerve roots.

CT LUMBAR MYELOGRAM FINDINGS:

Segmentation: Standard.

Alignment: Physiologic.

Vertebrae: No acute fracture or other focal pathologic process.

Conus medullaris and cauda equina: Conus extends to the L1 level.
Conus and cauda equina appear normal.

Paraspinal and other soft tissues: Several punctate nonobstructive
renal calculi in the lower pole of the left kidney. Mild aortic
atherosclerosis. Small bone island in the left sacral ala.

Disc levels:

T12-L1: Unchanged mild disc bulging and moderate right facet
arthropathy. No stenosis.

L1-L2: Unchanged mild disc bulging with superimposed small central
disc protrusion. No stenosis.

L2-L3: Unchanged mild disc bulging and endplate spurring eccentric
to the left. Unchanged mild left lateral recess stenosis. No spinal
canal or neuroforaminal stenosis.

L3-L4: Unchanged mild disc bulging and endplate spurring. Unchanged
mild bilateral facet arthropathy. Unchanged mild spinal canal and
bilateral lateral recess stenosis. Unchanged mild right
neuroforaminal stenosis. No left neuroforaminal stenosis.

L4-L5: Unchanged mild disc bulging and bilateral facet arthropathy.
Unchanged mild spinal canal stenosis with mild-to-moderate right and
mild left lateral recess stenosis. Unchanged moderate right
neuroforaminal stenosis. No left neuroforaminal stenosis.

L5-S1: Unchanged mild disc bulging with progressive superimposed
left subarticular/foraminal and right foraminal disc protrusions.
Progressive severe right neuroforaminal stenosis with impingement of
the exiting right L5 nerve root. Unchanged moderate left
neuroforaminal stenosis and mild left lateral recess stenosis. No
spinal canal stenosis.
IMPRESSION: 1. Multilevel lumbar spondylosis as described above, progressed at
L5-S1 where there is worsening severe right neuroforaminal stenosis
with impingement of the exiting right L5 nerve root. Similar
appearing left-sided disc protrusion with mass effect on the
descending left S1 nerve root in the lateral recess and moderate
left neuroforaminal stenosis.
2. Unchanged mild spinal canal stenosis, mild to moderate right
lateral recess stenosis, and moderate right neuroforaminal stenosis
at L4-L5.
3. Unchanged mild spinal canal, bilateral lateral recess, and right
neuroforaminal stenosis at L3-L4.
4. No dynamic instability.
5. Punctate nonobstructive left nephrolithiasis.
6.  Aortic atherosclerosis ([id number]).

## 2021-07-07 DIAGNOSIS — M4712 Other spondylosis with myelopathy, cervical region: Secondary | ICD-10-CM | POA: Diagnosis not present

## 2021-07-07 DIAGNOSIS — M7918 Myalgia, other site: Secondary | ICD-10-CM | POA: Diagnosis not present

## 2021-07-07 DIAGNOSIS — M542 Cervicalgia: Secondary | ICD-10-CM | POA: Diagnosis not present

## 2021-07-13 DIAGNOSIS — K219 Gastro-esophageal reflux disease without esophagitis: Secondary | ICD-10-CM | POA: Diagnosis not present

## 2021-07-13 DIAGNOSIS — G43009 Migraine without aura, not intractable, without status migrainosus: Secondary | ICD-10-CM | POA: Diagnosis not present

## 2021-07-13 DIAGNOSIS — Z0000 Encounter for general adult medical examination without abnormal findings: Secondary | ICD-10-CM | POA: Diagnosis not present

## 2021-07-13 DIAGNOSIS — M5090 Cervical disc disorder, unspecified, unspecified cervical region: Secondary | ICD-10-CM | POA: Diagnosis not present

## 2021-07-13 DIAGNOSIS — K581 Irritable bowel syndrome with constipation: Secondary | ICD-10-CM | POA: Diagnosis not present

## 2021-07-13 DIAGNOSIS — M810 Age-related osteoporosis without current pathological fracture: Secondary | ICD-10-CM | POA: Diagnosis not present

## 2021-07-13 DIAGNOSIS — E785 Hyperlipidemia, unspecified: Secondary | ICD-10-CM | POA: Diagnosis not present

## 2021-07-13 DIAGNOSIS — R634 Abnormal weight loss: Secondary | ICD-10-CM | POA: Diagnosis not present

## 2021-07-13 DIAGNOSIS — F40243 Fear of flying: Secondary | ICD-10-CM | POA: Diagnosis not present

## 2021-07-14 DIAGNOSIS — M47812 Spondylosis without myelopathy or radiculopathy, cervical region: Secondary | ICD-10-CM | POA: Diagnosis not present

## 2021-08-10 DIAGNOSIS — M47812 Spondylosis without myelopathy or radiculopathy, cervical region: Secondary | ICD-10-CM | POA: Diagnosis not present

## 2021-08-28 DIAGNOSIS — M47812 Spondylosis without myelopathy or radiculopathy, cervical region: Secondary | ICD-10-CM | POA: Diagnosis not present

## 2021-09-18 ENCOUNTER — Other Ambulatory Visit: Payer: Self-pay

## 2021-09-18 ENCOUNTER — Ambulatory Visit: Payer: Medicare PPO | Attending: Student | Admitting: Physical Therapy

## 2021-09-18 ENCOUNTER — Encounter: Payer: Self-pay | Admitting: Physical Therapy

## 2021-09-18 DIAGNOSIS — M542 Cervicalgia: Secondary | ICD-10-CM | POA: Insufficient documentation

## 2021-09-18 DIAGNOSIS — M6281 Muscle weakness (generalized): Secondary | ICD-10-CM | POA: Diagnosis not present

## 2021-09-18 NOTE — Therapy (Signed)
[facility] [address] [place], [place], [address] Phone: [phone]   Fax:  [phone]  Physical Therapy Evaluation  Patient Details  Name: Audrey Powers MRN: [id number] Date of Birth: 12/07/1951 Referring Provider (PT): Viona Gilmore NP   Encounter Date: 09/18/2021   PT End of Session - 09/18/21 1203     Visit Number 1    Date for PT Re-Evaluation 11/13/21    Authorization Type [employer] will submit 12 visits expected    Progress Note Due on Visit 10    PT Start Time 1100    PT Stop Time 1155    PT Time Calculation (min) 55 min    Activity Tolerance Patient tolerated treatment well             Past Medical History:  Diagnosis Date   Anxiety    Arthritis    Bunion    DDD (degenerative disc disease), cervical    Degenerative scoliosis in adult patient 08/22/2019   Depression    Endometrial polyp    Fibrocystic breast changes    Fundic gland polyps of stomach, benign 08/2016   GERD (gastroesophageal reflux disease)    Headache, migraine    Hirsutism    Hyperlipidemia    IBS (irritable bowel syndrome)    Internal and external prolapsed hemorrhoids 09/04/2018   Iron deficiency anemia    Osteoarthritis    Osteoporosis    PONV (postoperative nausea and vomiting)    Tinnitus     Past Surgical History:  Procedure Laterality Date   CARPAL TUNNEL RELEASE Left 01/2021   CERVICAL FUSION  05/2020   COLONOSCOPY     DILATION AND CURETTAGE OF UTERUS  1995   x 2   EUS     HYSTEROSCOPY     LUMBAR FUSION  07/2019   and disc replacement   TUBAL LIGATION  1989   UPPER GASTROINTESTINAL ENDOSCOPY      There were no vitals filed for this visit.    Subjective Assessment - 09/18/21 1105     Subjective 2 years cervical fusion, 3 years ago back fusion;  had PT for strengthening but identified a "knot" in muscle.  Thought it may be bone so have x-ray. Hurts to turn head.  [facility] [address] had  1x of DN.  Had an injection steroid helped but then returned.  Had Lidocaine with only 6 hours relief recently.  Headaches sometimes.    Pertinent History Goes by "Audrey Powers" C3-6 fusion  06/19/20;  Lumbar fusion 07/2019  osteoporosis;  no MRI b/c of braces    Limitations House hold activities    How long can you sit comfortably? 1-1 1/2 hours    Diagnostic tests may do another MRI when braces off    Patient Stated Goals see if we can get rid of pain in right upper trap muscle and neck pain    Currently in Pain? Yes    Pain Score 4     Pain Location Neck    Pain Orientation Right    Pain Type Chronic pain    Pain Frequency Constant    Aggravating Factors  turning to the right; sidebending left;  looking down; food prep; sewing, keyboarding; vacuming, sweeping    Pain Relieving Factors lying down 2 pillows propped up (this is how I read); Tynelol                [facility] PT Assessment - 09/18/21 0001  Assessment   Medical Diagnosis cervicalgia    Referring Provider (PT) Viona Gilmore NP    Hand Dominance Right    Next MD Visit December    Prior Therapy yes for neck -  not doing previous HEP      Precautions   Precautions None      Restrictions   Weight Bearing Restrictions No      Balance Screen   Has the patient fallen in the past 6 months No    Has the patient had a decrease in activity level because of a fear of falling?  No    Is the patient reluctant to leave their home because of a fear of falling?  No      Prior Function   Level of Independence Independent with basic ADLs    Vocation Retired    Leisure sewing; reading, walking      Observation/Other Assessments   Focus on Therapeutic Outcomes (FOTO)  50%      Posture/Postural Control   Posture/Postural Control No significant limitations      AROM   Overall AROM Comments UE ROM WFLS    Cervical Flexion 55    Cervical Extension 20    Cervical - Right Side Bend 15    Cervical - Left Side Bend 22    Cervical  - Right Rotation 30    Cervical - Left Rotation 35      Strength   Overall Strength Comments UEs symmetrical grossly 4/5      Palpation   Palpation comment tender points in right upper trap, levator scap; shortened tissue suboccipitals                        Objective measurements completed on examination: See above findings.       [facility] Adult PT Treatment/Exercise - 09/18/21 0001       Manual Therapy   Soft tissue mobilization right cervical paraspinals, upper trap, levator scap, suboccipitals              Trigger Point Dry Needling - 09/18/21 0001     Consent Given? Yes    Muscles Treated Head and Neck Upper trapezius;Levator scapulae;Suboccipitals;Cervical multifidi    Dry Needling Comments right only    Upper Trapezius Response Twitch reponse elicited;Palpable increased muscle length    Suboccipitals Response Palpable increased muscle length    Levator Scapulae Response Palpable increased muscle length    Cervical multifidi Response Palpable increased muscle length                   PT Education - 09/18/21 1200     Education Details dry needling after care    Person(s) Educated Patient    Methods Explanation;Handout    Comprehension Verbalized understanding              PT Short Term Goals - 09/18/21 1223       PT SHORT TERM GOAL #1   Title The patient will demonstrate compliance with a basic beginning HEP for postural strengthening    Time 4    Period Weeks    Status New    Target Date 10/16/21      PT SHORT TERM GOAL #2   Title The patient will report a 30% improvement in right neck/upper trap region pain with sewing, reading; food prep, keyboarding    Time 4    Period Weeks    Status New  PT SHORT TERM GOAL #3   Title Improved right sidednding to 18 degrees and right rotation to 35 degrees needed for driving    Time 4    Period Weeks    Status New               PT Long Term Goals - 09/18/21 1225        PT LONG TERM GOAL #1   Title The patient will be independent with a safe self progression of HEP    Time 8    Period Weeks    Status New    Target Date 11/13/21      PT LONG TERM GOAL #2   Title The patient will report a 60% improvement in right neck pain with sewing, reading, food prep, keyboarding    Time 8    Period Weeks    Status New      PT LONG TERM GOAL #3   Title The patient will have improved right sidebending to 25 degrees and bil rotation to 40 degrees needed for driving    Time 8    Period Weeks    Status New      PT LONG TERM GOAL #4   Title Upper quadrant strength grossly 4+/5 needed for carrying shopping bags, small luggage for traveling    Time 8    Period Weeks    Status New      PT LONG TERM GOAL #5   Title FOTO score improved to > 53%    Time 8    Period Weeks    Status New                    Plan - 09/18/21 1204     Clinical Impression Statement The patient had multi level cervical fusion in June 2021.  She completed 20 visits of outpatient PT at another facility following surgery.  She complains of peristent right neck and upper trap region toward her shoulder.  She has had several steroid and Lidocaine injections without lasting relief.  She has palpable trigger points in right upper trap and levator scap muscles and taut bands in right cervical paraspinals and suboccipitals.  Limited cervical ROM as expected post multi level fusion but asymmetrically limited with rotation and sidebending : right rotation  15 degrees, left rotation 22 degrees, right sidebending 30, left sidebending 35 degrees.  Upper quadrant strength grossly 4/5 and symmetrical.  FOTO score indicates moderate impairment at 50%.  Functionally she is limited with turning for driving, sitting for sewing and reading and vacumning and sweeping.  She has an upcoming trip to [place] and is concerned how her pain and impairments might affect her travels.  She states she has not continued  her previous HEP stating some of the ex's are painful.  She hopes to return to the gym at some point although it has been > 2 years.  She would benefit from treatment to address these impairments and to facilitate a return to an appropriate exercise program.    Personal Factors and Comorbidities Comorbidity 1;Comorbidity 2;Comorbidity 3+    Comorbidities lumbar fusion surgery L4-01 Aug 2019 ; cervical fusion C3-01 June 2020; osteoporosis    Examination-Activity Limitations Sit;Carry;Sleep;Lift;Other    Examination-Participation Restrictions Cleaning;Meal Prep;Driving;Laundry    Stability/Clinical Decision Making Stable/Uncomplicated    Clinical Decision Making Low    Rehab Potential Good    PT Frequency 2x / week    PT Duration 8 weeks  PT Treatment/Interventions ADLs/Self Care Home Management;Cryotherapy;Electrical Stimulation;Moist Heat;Ultrasound;Therapeutic exercise;Neuromuscular re-education;Manual techniques;Therapeutic activities;Patient/family education;Dry needling;Taping    PT Next Visit Plan assess response to DN #1 (although had it 1x in past); start basic postural strengthening: band rows and extensions; wall push ups or counter push ups    PT Home Exercise Plan start next time    Consulted and Agree with Plan of Care Patient             Patient will benefit from skilled therapeutic intervention in order to improve the following deficits and impairments:  Decreased range of motion, Increased fascial restricitons, Pain, Decreased activity tolerance, Decreased strength  Visit Diagnosis: Cervicalgia - Plan: PT plan of care cert/re-cert  Muscle weakness (generalized) - Plan: PT plan of care cert/re-cert     Problem List Patient Active Problem List   Diagnosis Date Noted   Degenerative scoliosis in adult patient 08/22/2019   Internal and external prolapsed hemorrhoids 09/04/2018   Ruben Im, PT 09/18/21 12:30 PM Phone: [phone] Fax: [phone]  Alvera Singh, PT 09/18/2021, 12:30 PM  [facility] [address], [place], [place], [address] Phone: [phone]   Fax:  [phone]  Name: Audrey Powers MRN: [id number] Date of Birth: 07-04-1951

## 2021-09-18 NOTE — Patient Instructions (Signed)

## 2021-09-21 ENCOUNTER — Encounter: Payer: Self-pay | Admitting: Physical Therapy

## 2021-09-21 ENCOUNTER — Other Ambulatory Visit: Payer: Self-pay

## 2021-09-21 ENCOUNTER — Ambulatory Visit: Payer: Medicare PPO | Admitting: Physical Therapy

## 2021-09-21 DIAGNOSIS — M542 Cervicalgia: Secondary | ICD-10-CM | POA: Diagnosis not present

## 2021-09-21 DIAGNOSIS — M6281 Muscle weakness (generalized): Secondary | ICD-10-CM

## 2021-09-21 NOTE — Therapy (Signed)
[facility] [address], [place], [place], [address] Phone: [phone]   Fax:  [phone]  Physical Therapy Treatment  Patient Details  Name: Audrey Powers MRN: [id number] Date of Birth: Nov 05, 1951 Referring Provider (PT): Viona Gilmore NP   Encounter Date: 09/21/2021   PT End of Session - 09/21/21 1400     Visit Number 2    Date for PT Re-Evaluation 11/13/21    Authorization Type [employer] will submit 12 visits expected    Progress Note Due on Visit 10    PT Start Time 1400    PT Stop Time 1442    PT Time Calculation (min) 42 min    Activity Tolerance Patient tolerated treatment well    Behavior During Therapy [facility] for tasks assessed/performed             Past Medical History:  Diagnosis Date   Anxiety    Arthritis    Bunion    DDD (degenerative disc disease), cervical    Degenerative scoliosis in adult patient 08/22/2019   Depression    Endometrial polyp    Fibrocystic breast changes    Fundic gland polyps of stomach, benign 08/2016   GERD (gastroesophageal reflux disease)    Headache, migraine    Hirsutism    Hyperlipidemia    IBS (irritable bowel syndrome)    Internal and external prolapsed hemorrhoids 09/04/2018   Iron deficiency anemia    Osteoarthritis    Osteoporosis    PONV (postoperative nausea and vomiting)    Tinnitus     Past Surgical History:  Procedure Laterality Date   CARPAL TUNNEL RELEASE Left 01/2021   CERVICAL FUSION  05/2020   COLONOSCOPY     DILATION AND CURETTAGE OF UTERUS  1995   x 2   EUS     HYSTEROSCOPY     LUMBAR FUSION  07/2019   and disc replacement   TUBAL LIGATION  1989   UPPER GASTROINTESTINAL ENDOSCOPY      There were no vitals filed for this visit.   Subjective Assessment - 09/21/21 1404     Subjective Pain today: if I bend to the Rt that is more pain 5/10. Dry needling went well.    Pertinent History Goes by "Ruben" C3-6 fusion   06/19/20;  Lumbar fusion 07/2019  osteoporosis;  no MRI b/c of braces    Currently in Pain? Yes   When pt side bends to the RT 5/10, otherwise 0/10   Pain Score 5                                [facility] Adult PT Treatment/Exercise - 09/21/21 0001       Shoulder Exercises: Standing   Extension --   red band 10x bil, added to HEP   Row --   Red band 10x, added to HEP   Other Standing Exercises Wall push up 10x added to HEP      Manual Therapy   Soft tissue mobilization right cervical paraspinals, upper trap, levator scap, suboccipitals                     PT Education - 09/21/21 1409     Education Details HEP    Person(s) Educated Patient    Methods Explanation;Demonstration;Tactile cues;Verbal cues;Handout    Comprehension Returned demonstration;Verbalized understanding  PT Short Term Goals - 09/18/21 1223       PT SHORT TERM GOAL #1   Title The patient will demonstrate compliance with a basic beginning HEP for postural strengthening    Time 4    Period Weeks    Status New    Target Date 10/16/21      PT SHORT TERM GOAL #2   Title The patient will report a 30% improvement in right neck/upper trap region pain with sewing, reading; food prep, keyboarding    Time 4    Period Weeks    Status New      PT SHORT TERM GOAL #3   Title Improved right sidednding to 18 degrees and right rotation to 35 degrees needed for driving    Time 4    Period Weeks    Status New               PT Long Term Goals - 09/18/21 1225       PT LONG TERM GOAL #1   Title The patient will be independent with a safe self progression of HEP    Time 8    Period Weeks    Status New    Target Date 11/13/21      PT LONG TERM GOAL #2   Title The patient will report a 60% improvement in right neck pain with sewing, reading, food prep, keyboarding    Time 8    Period Weeks    Status New      PT LONG TERM GOAL #3   Title The patient will have  improved right sidebending to 25 degrees and bil rotation to 40 degrees needed for driving    Time 8    Period Weeks    Status New      PT LONG TERM GOAL #4   Title Upper quadrant strength grossly 4+/5 needed for carrying shopping bags, small luggage for traveling    Time 8    Period Weeks    Status New      PT LONG TERM GOAL #5   Title FOTO score improved to > 53%    Time 8    Period Weeks    Status New                   Plan - 09/21/21 1401     Clinical Impression Statement Initial HEP was established today. Pt is looking forward to next dry needling session which will be Wednesday of this week. Rt cervical muscles are rigid with multiple trigger points.    Personal Factors and Comorbidities Comorbidity 1;Comorbidity 2;Comorbidity 3+    Comorbidities lumbar fusion surgery L4-01 Aug 2019 ; cervical fusion C3-01 June 2020; osteoporosis    Examination-Activity Limitations Sit;Carry;Sleep;Lift;Other    Examination-Participation Restrictions Cleaning;Meal Prep;Driving;Laundry    Stability/Clinical Decision Making Stable/Uncomplicated    Rehab Potential Good    PT Frequency 2x / week    PT Duration 8 weeks    PT Treatment/Interventions ADLs/Self Care Home Management;Cryotherapy;Electrical Stimulation;Moist Heat;Ultrasound;Therapeutic exercise;Neuromuscular re-education;Manual techniques;Therapeutic activities;Patient/family education;Dry needling;Taping    PT Next Visit Plan Review of initial HEP , DN next session to Rt cervical.    PT Home Exercise Plan Access Code [email or web address]    Consulted and Agree with Plan of Care Patient             Patient will benefit from skilled therapeutic intervention in order to improve the following deficits and impairments:  Decreased range of motion, Increased  fascial restricitons, Pain, Decreased activity tolerance, Decreased strength  Visit Diagnosis: Muscle weakness (generalized)  Cervicalgia     Problem List Patient Active  Problem List   Diagnosis Date Noted   Degenerative scoliosis in adult patient 08/22/2019   Internal and external prolapsed hemorrhoids 09/04/2018    Rozell Theiler, PTA 09/21/2021, 2:44 PM  [facility] [address], [place], [place], [address] Phone: [phone]   Fax:  [phone]  Name: Laquonda Welby MRN: [id number] Date of Birth: 1951-06-30  Access Code: [email or web address]: [email or web address]: 09/26/2022Prepared by: Anderson Malta CochranExercises  Standing Row with Anchored Resistance - 1 x daily - 7 x weekly - 2 sets - 10 reps  Single Arm Shoulder Extension with Anchored Resistance - 1 x daily - 7 x weekly - 2 sets - 10 reps  Wall Push Up - 1 x daily - 7 x weekly - 2 sets - 10 reps  Seated Cervical Sidebending Stretch - 3 x daily - 7 x weekly - 2 sets - 20 hold

## 2021-09-23 ENCOUNTER — Ambulatory Visit: Payer: Medicare PPO

## 2021-09-23 ENCOUNTER — Other Ambulatory Visit: Payer: Self-pay

## 2021-09-23 DIAGNOSIS — M6281 Muscle weakness (generalized): Secondary | ICD-10-CM | POA: Diagnosis not present

## 2021-09-23 DIAGNOSIS — M542 Cervicalgia: Secondary | ICD-10-CM

## 2021-09-23 NOTE — Therapy (Signed)
[facility] [address] [place], [place], [address] Phone: [phone]   Fax:  [phone]  Physical Therapy Treatment  Patient Details  Name: Audrey Powers MRN: [id number] Date of Birth: 05-15-1951 Referring Provider (PT): Viona Gilmore NP   Encounter Date: 09/23/2021   PT End of Session - 09/23/21 0840     Visit Number 3    Date for PT Re-Evaluation 11/13/21    Authorization Type [employer]: 12 visits 9/23-11/18/22    Authorization - Visit Number 3    Authorization - Number of Visits 12    Progress Note Due on Visit 10    PT Start Time 0800    PT Stop Time 5027    PT Time Calculation (min) 43 min    Activity Tolerance Patient tolerated treatment well    Behavior During Therapy [facility] for tasks assessed/performed             Past Medical History:  Diagnosis Date   Anxiety    Arthritis    Bunion    DDD (degenerative disc disease), cervical    Degenerative scoliosis in adult patient 08/22/2019   Depression    Endometrial polyp    Fibrocystic breast changes    Fundic gland polyps of stomach, benign 08/2016   GERD (gastroesophageal reflux disease)    Headache, migraine    Hirsutism    Hyperlipidemia    IBS (irritable bowel syndrome)    Internal and external prolapsed hemorrhoids 09/04/2018   Iron deficiency anemia    Osteoarthritis    Osteoporosis    PONV (postoperative nausea and vomiting)    Tinnitus     Past Surgical History:  Procedure Laterality Date   CARPAL TUNNEL RELEASE Left 01/2021   CERVICAL FUSION  05/2020   COLONOSCOPY     DILATION AND CURETTAGE OF UTERUS  1995   x 2   EUS     HYSTEROSCOPY     LUMBAR FUSION  07/2019   and disc replacement   TUBAL LIGATION  1989   UPPER GASTROINTESTINAL ENDOSCOPY      There were no vitals filed for this visit.   Subjective Assessment - 09/23/21 0805     Subjective I feel pain when I turn.    Currently in Pain? Yes    Pain Score 4      Pain Location Neck    Pain Orientation Right    Pain Descriptors / Indicators Aching;Sore    Pain Type Chronic pain    Pain Onset More than a month ago    Pain Frequency Constant    Aggravating Factors  turning head, looking down, housework    Pain Relieving Factors holding head straight, lying down on pillow                               [facility] Adult PT Treatment/Exercise - 09/23/21 0001       Exercises   Exercises Neck      Neck Exercises: Stretches   Upper Trapezius Stretch 2 reps;20 seconds      Shoulder Exercises: Standing   Extension Strengthening;20 reps;Theraband    Theraband Level (Shoulder Extension) Level 2 (Red)    Row Strengthening;20 reps;Theraband    Theraband Level (Shoulder Row) Level 2 (Red)    Other Standing Exercises Wall push up 10x added to HEP      Manual Therapy   Soft tissue mobilization right cervical paraspinals,  upper trap, levator scap, suboccipitals              Trigger Point Dry Needling - 09/23/21 0001     Consent Given? Yes    Muscles Treated Head and Neck Upper trapezius;Levator scapulae;Suboccipitals;Cervical multifidi    Dry Needling Comments bil    Upper Trapezius Response Twitch reponse elicited;Palpable increased muscle length    Suboccipitals Response Palpable increased muscle length    Levator Scapulae Response Palpable increased muscle length    Cervical multifidi Response Palpable increased muscle length                     PT Short Term Goals - 09/23/21 3846       PT SHORT TERM GOAL #1   Title The patient will demonstrate compliance with a basic beginning HEP for postural strengthening    Status On-going      PT SHORT TERM GOAL #2   Title The patient will report a 30% improvement in right neck/upper trap region pain with sewing, reading; food prep, keyboarding    Status On-going               PT Long Term Goals - 09/18/21 1225       PT LONG TERM GOAL #1   Title The  patient will be independent with a safe self progression of HEP    Time 8    Period Weeks    Status New    Target Date 11/13/21      PT LONG TERM GOAL #2   Title The patient will report a 60% improvement in right neck pain with sewing, reading, food prep, keyboarding    Time 8    Period Weeks    Status New      PT LONG TERM GOAL #3   Title The patient will have improved right sidebending to 25 degrees and bil rotation to 40 degrees needed for driving    Time 8    Period Weeks    Status New      PT LONG TERM GOAL #4   Title Upper quadrant strength grossly 4+/5 needed for carrying shopping bags, small luggage for traveling    Time 8    Period Weeks    Status New      PT LONG TERM GOAL #5   Title FOTO score improved to > 53%    Time 8    Period Weeks    Status New                   Plan - 09/23/21 6599     Clinical Impression Statement PT reviewed new HEP with pt and she did well with all aspects. Pt reports increased ease with turning her head to look over her shoulder with driving.  Minor tactile cues required for alignment and for scapular relaxation.  Pt with good response to DN with twitch response in upper traps, cervical paraspinals and suboccipitals.  Improved tissue mobility and reduced tension reported after manual therapy today.  Pt will continue to benefit from skilled PT to address neck pain, tissue mobility and cervical mobility.    PT Frequency 2x / week    PT Duration 8 weeks    PT Treatment/Interventions ADLs/Self Care Home Management;Cryotherapy;Electrical Stimulation;Moist Heat;Ultrasound;Therapeutic exercise;Neuromuscular re-education;Manual techniques;Therapeutic activities;Patient/family education;Dry needling;Taping    PT Next Visit Plan assess response to DN and manual work.  Continue to address postural strength, cervical mobility and pain    PT  Home Exercise Plan Access Code [email or web address]    Recommended Other Services initial cert is signed     Consulted and Agree with Plan of Care Patient             Patient will benefit from skilled therapeutic intervention in order to improve the following deficits and impairments:  Decreased range of motion, Increased fascial restricitons, Pain, Decreased activity tolerance, Decreased strength  Visit Diagnosis: Muscle weakness (generalized)  Cervicalgia     Problem List Patient Active Problem List   Diagnosis Date Noted   Degenerative scoliosis in adult patient 08/22/2019   Internal and external prolapsed hemorrhoids 09/04/2018    Sigurd Sos, PT 09/23/21 8:44 AM   [facility] [address] [place], [place], [address] Phone: [phone]   Fax:  [phone]  Name: Audrey Powers MRN: [id number] Date of Birth: 1951-06-24

## 2021-09-30 ENCOUNTER — Ambulatory Visit: Payer: Medicare PPO | Attending: Student

## 2021-09-30 ENCOUNTER — Other Ambulatory Visit: Payer: Self-pay

## 2021-09-30 DIAGNOSIS — M6281 Muscle weakness (generalized): Secondary | ICD-10-CM | POA: Insufficient documentation

## 2021-09-30 DIAGNOSIS — M542 Cervicalgia: Secondary | ICD-10-CM | POA: Insufficient documentation

## 2021-09-30 NOTE — Therapy (Signed)
[facility] @ [place], [place], [address] Phone:     Fax:     Physical Therapy Treatment  Patient Details  Name: Audrey Powers MRN: [id number] Date of Birth: Feb 17, 1951 Referring Provider (PT): Viona Gilmore NP   Encounter Date: 09/30/2021   PT End of Session - 09/30/21 1229     Visit Number 4    Date for PT Re-Evaluation 11/13/21    Authorization Type [employer]: 12 visits 9/23-11/18/22    Authorization - Visit Number 4    Authorization - Number of Visits 12    PT Start Time 3329    PT Stop Time 1232    PT Time Calculation (min) 39 min    Activity Tolerance Patient tolerated treatment well    Behavior During Therapy [facility] for tasks assessed/performed             Past Medical History:  Diagnosis Date   Anxiety    Arthritis    Bunion    DDD (degenerative disc disease), cervical    Degenerative scoliosis in adult patient 08/22/2019   Depression    Endometrial polyp    Fibrocystic breast changes    Fundic gland polyps of stomach, benign 08/2016   GERD (gastroesophageal reflux disease)    Headache, migraine    Hirsutism    Hyperlipidemia    IBS (irritable bowel syndrome)    Internal and external prolapsed hemorrhoids 09/04/2018   Iron deficiency anemia    Osteoarthritis    Osteoporosis    PONV (postoperative nausea and vomiting)    Tinnitus     Past Surgical History:  Procedure Laterality Date   CARPAL TUNNEL RELEASE Left 01/2021   CERVICAL FUSION  05/2020   COLONOSCOPY     DILATION AND CURETTAGE OF UTERUS  1995   x 2   EUS     HYSTEROSCOPY     LUMBAR FUSION  07/2019   and disc replacement   TUBAL LIGATION  1989   UPPER GASTROINTESTINAL ENDOSCOPY      There were no vitals filed for this visit.   Subjective Assessment - 09/30/21 1152     Subjective I felt sore after dry needling. I am doing the exercises.    Pertinent History Goes by "Audrey Powers" C3-6 fusion  06/19/20;  Lumbar  fusion 07/2019  osteoporosis;  no MRI b/c of braces    Patient Stated Goals see if we can get rid of pain in right upper trap muscle and neck pain    Currently in Pain? Yes    Pain Score 3     Pain Location Neck    Pain Orientation Right    Pain Descriptors / Indicators Aching;Sore    Pain Type Chronic pain    Pain Onset More than a month ago    Aggravating Factors  turning head, looking down, using arms    Pain Relieving Factors lying down on pillow, taking breaks                               [facility] Adult PT Treatment/Exercise - 09/30/21 0001       Neck Exercises: Machines for Strengthening   UBE (Upper Arm Bike) Level 1x 6 minutes (3/3)-PT present to discuss progress      Neck Exercises: Stretches   Upper Trapezius Stretch 2 reps;20 seconds      Shoulder Exercises: Standing   External Rotation  Strengthening;Both;20 reps;Theraband    Theraband Level (Shoulder External Rotation) Level 2 (Red)    Other Standing Exercises Wall push up 10x added to HEP      Manual Therapy   Soft tissue mobilization right cervical paraspinals, upper trap, levator scap, suboccipitals              Trigger Point Dry Needling - 09/30/21 0001     Consent Given? Yes    Muscles Treated Head and Neck Upper trapezius;Levator scapulae;Suboccipitals;Cervical multifidi    Dry Needling Comments bil    Upper Trapezius Response Twitch reponse elicited;Palpable increased muscle length    Suboccipitals Response Palpable increased muscle length    Levator Scapulae Response Palpable increased muscle length    Cervical multifidi Response Palpable increased muscle length                     PT Short Term Goals - 09/23/21 0347       PT SHORT TERM GOAL #1   Title The patient will demonstrate compliance with a basic beginning HEP for postural strengthening    Status On-going      PT SHORT TERM GOAL #2   Title The patient will report a 30% improvement in right neck/upper trap  region pain with sewing, reading; food prep, keyboarding    Status On-going               PT Long Term Goals - 09/18/21 1225       PT LONG TERM GOAL #1   Title The patient will be independent with a safe self progression of HEP    Time 8    Period Weeks    Status New    Target Date 11/13/21      PT LONG TERM GOAL #2   Title The patient will report a 60% improvement in right neck pain with sewing, reading, food prep, keyboarding    Time 8    Period Weeks    Status New      PT LONG TERM GOAL #3   Title The patient will have improved right sidebending to 25 degrees and bil rotation to 40 degrees needed for driving    Time 8    Period Weeks    Status New      PT LONG TERM GOAL #4   Title Upper quadrant strength grossly 4+/5 needed for carrying shopping bags, small luggage for traveling    Time 8    Period Weeks    Status New      PT LONG TERM GOAL #5   Title FOTO score improved to > 53%    Time 8    Period Weeks    Status New                   Plan - 09/30/21 1211     Clinical Impression Statement Pt continues to do her exercises and  Pt reports increased ease with turning her head to look over her shoulder with driving. No other significant changes in symptoms since the start of care.  Minor tactile cues required for alignment and for scapular relaxation.  Pt with good response to DN with twitch response in upper traps, cervical paraspinals and suboccipitals.  Improved tissue mobility and reduced tension reported after manual therapy today.  Pt was sore after last DN session and PT advised to use heat and gentle flexibility after for symptom management.  Pt will continue to benefit from skilled PT to  address neck pain, tissue mobility and cervical mobility.    PT Frequency 2x / week    PT Duration 8 weeks    PT Treatment/Interventions ADLs/Self Care Home Management;Cryotherapy;Electrical Stimulation;Moist Heat;Ultrasound;Therapeutic exercise;Neuromuscular  re-education;Manual techniques;Therapeutic activities;Patient/family education;Dry needling;Taping    PT Next Visit Plan assess response to DN and manual work.  Continue to address postural strength, cervical mobility and pain.  Pt will be in [place] after the next 2 sessions and will return after.    PT Home Exercise Plan Access Code [email or web address]             Patient will benefit from skilled therapeutic intervention in order to improve the following deficits and impairments:  Decreased range of motion, Increased fascial restricitons, Pain, Decreased activity tolerance, Decreased strength  Visit Diagnosis: Muscle weakness (generalized)  Cervicalgia     Problem List Patient Active Problem List   Diagnosis Date Noted   Degenerative scoliosis in adult patient 08/22/2019   Internal and external prolapsed hemorrhoids 09/04/2018    Sigurd Sos, PT 09/30/21 12:34 PM   [facility] @ [place], [place], [address] Phone:     Fax:     Name: Audrey Powers MRN: [id number] Date of Birth: Sep 21, 1951

## 2021-10-02 ENCOUNTER — Other Ambulatory Visit: Payer: Self-pay

## 2021-10-02 ENCOUNTER — Ambulatory Visit: Payer: Medicare PPO | Admitting: Physical Therapy

## 2021-10-02 DIAGNOSIS — M6281 Muscle weakness (generalized): Secondary | ICD-10-CM

## 2021-10-02 DIAGNOSIS — M542 Cervicalgia: Secondary | ICD-10-CM | POA: Diagnosis not present

## 2021-10-02 NOTE — Therapy (Signed)
[facility] @ [place], [place], [address] Phone: [phone]   Fax:  [phone]  Physical Therapy Treatment  Patient Details  Name: Audrey Powers MRN: [id number] Date of Birth: February 17, 1951 Referring Provider (PT): Viona Gilmore NP   Encounter Date: 10/02/2021   PT End of Session - 10/02/21 0800     Visit Number 5    Date for PT Re-Evaluation 11/13/21    Authorization Type [employer]: 12 visits 9/23-11/18/22    Authorization - Visit Number 5    Authorization - Number of Visits 12    Progress Note Due on Visit 10    PT Start Time 0800    PT Stop Time 0839    PT Time Calculation (min) 39 min    Activity Tolerance Patient tolerated treatment well    Behavior During Therapy [facility] for tasks assessed/performed             Past Medical History:  Diagnosis Date   Anxiety    Arthritis    Bunion    DDD (degenerative disc disease), cervical    Degenerative scoliosis in adult patient 08/22/2019   Depression    Endometrial polyp    Fibrocystic breast changes    Fundic gland polyps of stomach, benign 08/2016   GERD (gastroesophageal reflux disease)    Headache, migraine    Hirsutism    Hyperlipidemia    IBS (irritable bowel syndrome)    Internal and external prolapsed hemorrhoids 09/04/2018   Iron deficiency anemia    Osteoarthritis    Osteoporosis    PONV (postoperative nausea and vomiting)    Tinnitus     Past Surgical History:  Procedure Laterality Date   CARPAL TUNNEL RELEASE Left 01/2021   CERVICAL FUSION  05/2020   COLONOSCOPY     DILATION AND CURETTAGE OF UTERUS  1995   x 2   EUS     HYSTEROSCOPY     LUMBAR FUSION  07/2019   and disc replacement   TUBAL LIGATION  1989   UPPER GASTROINTESTINAL ENDOSCOPY      There were no vitals filed for this visit.   Subjective Assessment - 10/02/21 0807     Subjective Better! DN went well, a little sore after. this AM  i am less sore.     Pertinent History Goes by "Audrey Powers" C3-6 fusion  06/19/20;  Lumbar fusion 07/2019  osteoporosis;  no MRI b/c of braces    Currently in Pain? Yes    Pain Score 2     Pain Location Neck    Pain Orientation Right    Pain Descriptors / Indicators Sore;Dull    Multiple Pain Sites No                               [facility] Adult PT Treatment/Exercise - 10/02/21 0001       Neck Exercises: Machines for Strengthening   UBE (Upper Arm Bike) L 1.5 3x3 with discussion of status      Shoulder Exercises: Standing   External Rotation Strengthening;Both;10 reps;Theraband    Theraband Level (Shoulder External Rotation) Level 3 (Green)    Extension Strengthening;20 reps;Theraband    Theraband Level (Shoulder Extension) Level 2 (Red)    Row Strengthening;Both;10 reps;Theraband    Theraband Level (Shoulder Row) Level 3 (Green)      Manual Therapy   Manual therapy comments Passive stretching to Rt upper trap  Soft tissue mobilization right cervical paraspinals, upper trap, levator scap, suboccipitals                       PT Short Term Goals - 09/23/21 6195       PT SHORT TERM GOAL #1   Title The patient will demonstrate compliance with a basic beginning HEP for postural strengthening    Status On-going      PT SHORT TERM GOAL #2   Title The patient will report a 30% improvement in right neck/upper trap region pain with sewing, reading; food prep, keyboarding    Status On-going               PT Long Term Goals - 09/18/21 1225       PT LONG TERM GOAL #1   Title The patient will be independent with a safe self progression of HEP    Time 8    Period Weeks    Status New    Target Date 11/13/21      PT LONG TERM GOAL #2   Title The patient will report a 60% improvement in right neck pain with sewing, reading, food prep, keyboarding    Time 8    Period Weeks    Status New      PT LONG TERM GOAL #3   Title The patient will have improved right  sidebending to 25 degrees and bil rotation to 40 degrees needed for driving    Time 8    Period Weeks    Status New      PT LONG TERM GOAL #4   Title Upper quadrant strength grossly 4+/5 needed for carrying shopping bags, small luggage for traveling    Time 8    Period Weeks    Status New      PT LONG TERM GOAL #5   Title FOTO score improved to > 53%    Time 8    Period Weeks    Status New                   Plan - 10/02/21 0805     Clinical Impression Statement Pt arrives with "morning stiffness and soreness" She reports her soreness in the morning is lessening. Pt continues to report improvment with the ability to turn her head. Focus of todays treatment was 50/50 on postural strength and support and manual soft tissue work. Pt tolerating dry needling well. pt was able to progress to green band today her postural strength and was given new band for HEP.    Personal Factors and Comorbidities Comorbidity 1;Comorbidity 2;Comorbidity 3+    Comorbidities lumbar fusion surgery L4-01 Aug 2019 ; cervical fusion C3-01 June 2020; osteoporosis    Examination-Activity Limitations Sit;Carry;Sleep;Lift;Other    Rehab Potential Good    PT Frequency 2x / week    PT Duration 8 weeks    PT Treatment/Interventions ADLs/Self Care Home Management;Cryotherapy;Electrical Stimulation;Moist Heat;Ultrasound;Therapeutic exercise;Neuromuscular re-education;Manual techniques;Therapeutic activities;Patient/family education;Dry needling;Taping    PT Next Visit Plan assess response to DN and manual work.  Continue to address postural strength, cervical mobility and pain.  Pt will be in [place] after the next session and will return after.    PT Home Exercise Plan Access Code [email or web address]    Consulted and Agree with Plan of Care Patient             Patient will benefit from skilled therapeutic intervention in order to improve the following deficits  and impairments:  Decreased range of motion, Increased  fascial restricitons, Pain, Decreased activity tolerance, Decreased strength  Visit Diagnosis: Muscle weakness (generalized)  Cervicalgia     Problem List Patient Active Problem List   Diagnosis Date Noted   Degenerative scoliosis in adult patient 08/22/2019   Internal and external prolapsed hemorrhoids 09/04/2018    Bengie Kaucher, PTA 10/02/2021, 8:37 AM  [facility] @ [place], [place], [address] Phone: [phone]   Fax:  [phone]  Name: Audrey Powers MRN: [id number] Date of Birth: 02/27/1951

## 2021-10-05 ENCOUNTER — Ambulatory Visit: Payer: Medicare PPO | Admitting: Physical Therapy

## 2021-10-05 ENCOUNTER — Other Ambulatory Visit: Payer: Self-pay

## 2021-10-05 ENCOUNTER — Encounter: Payer: Self-pay | Admitting: Physical Therapy

## 2021-10-05 DIAGNOSIS — M6281 Muscle weakness (generalized): Secondary | ICD-10-CM

## 2021-10-05 DIAGNOSIS — M542 Cervicalgia: Secondary | ICD-10-CM

## 2021-10-05 NOTE — Therapy (Signed)
[facility] @ [place], [place], [address] Phone: [phone]   Fax:  [phone]  Physical Therapy Treatment  Patient Details  Name: Audrey Powers MRN: [id number] Date of Birth: August 06, 1951 Referring Provider (PT): Viona Gilmore NP   Encounter Date: 10/05/2021   PT End of Session - 10/05/21 1356     Visit Number 6    Date for PT Re-Evaluation 11/13/21    Authorization Type [employer]: 12 visits 9/23-11/18/22    Authorization - Visit Number 6    Authorization - Number of Visits 12    Progress Note Due on Visit 10    PT Start Time 7026    PT Stop Time 1435    PT Time Calculation (min) 39 min    Activity Tolerance Patient tolerated treatment well    Behavior During Therapy [facility] for tasks assessed/performed             Past Medical History:  Diagnosis Date   Anxiety    Arthritis    Bunion    DDD (degenerative disc disease), cervical    Degenerative scoliosis in adult patient 08/22/2019   Depression    Endometrial polyp    Fibrocystic breast changes    Fundic gland polyps of stomach, benign 08/2016   GERD (gastroesophageal reflux disease)    Headache, migraine    Hirsutism    Hyperlipidemia    IBS (irritable bowel syndrome)    Internal and external prolapsed hemorrhoids 09/04/2018   Iron deficiency anemia    Osteoarthritis    Osteoporosis    PONV (postoperative nausea and vomiting)    Tinnitus     Past Surgical History:  Procedure Laterality Date   CARPAL TUNNEL RELEASE Left 01/2021   CERVICAL FUSION  05/2020   COLONOSCOPY     DILATION AND CURETTAGE OF UTERUS  1995   x 2   EUS     HYSTEROSCOPY     LUMBAR FUSION  07/2019   and disc replacement   TUBAL LIGATION  1989   UPPER GASTROINTESTINAL ENDOSCOPY      There were no vitals filed for this visit.   Subjective Assessment - 10/05/21 1358     Subjective Neck was sore the next day but not terrible.    Pertinent History  Goes by "Itzia" C3-6 fusion  06/19/20;  Lumbar fusion 07/2019  osteoporosis;  no MRI b/c of braces    Currently in Pain? No/denies                [facility] PT Assessment - 10/05/21 0001       AROM   Cervical - Right Rotation 40    Cervical - Left Rotation 45                           [facility] Adult PT Treatment/Exercise - 10/05/21 0001       Neck Exercises: Machines for Strengthening   UBE (Upper Arm Bike) L 1.5 3x3 with discussion of status      Shoulder Exercises: Standing   External Rotation Strengthening;Both;Theraband;15 reps    Theraband Level (Shoulder External Rotation) Level 3 (Green)    Extension Strengthening;Both;10 reps;Theraband    Theraband Level (Shoulder Extension) Level 3 (Green)    Row Strengthening;Both;Theraband;15 reps    Theraband Level (Shoulder Row) Level 3 (Green)      Manual Therapy   Manual therapy comments Passive stretching to Rt upper trap  Soft tissue mobilization right cervical paraspinals, upper trap, levator scap, suboccipitals                       PT Short Term Goals - 10/05/21 1438       PT SHORT TERM GOAL #1   Title The patient will demonstrate compliance with a basic beginning HEP for postural strengthening    Time 4    Period Weeks    Status Achieved    Target Date 10/16/21               PT Long Term Goals - 09/18/21 1225       PT LONG TERM GOAL #1   Title The patient will be independent with a safe self progression of HEP    Time 8    Period Weeks    Status New    Target Date 11/13/21      PT LONG TERM GOAL #2   Title The patient will report a 60% improvement in right neck pain with sewing, reading, food prep, keyboarding    Time 8    Period Weeks    Status New      PT LONG TERM GOAL #3   Title The patient will have improved right sidebending to 25 degrees and bil rotation to 40 degrees needed for driving    Time 8    Period Weeks    Status New      PT LONG TERM GOAL #4    Title Upper quadrant strength grossly 4+/5 needed for carrying shopping bags, small luggage for traveling    Time 8    Period Weeks    Status New      PT LONG TERM GOAL #5   Title FOTO score improved to > 53%    Time 8    Period Weeks    Status New                   Plan - 10/05/21 1356     Clinical Impression Statement Pt arrives with no pain to report. She is doing her new tband at home with no issues ( green ) and feels she is turning her head better and wakes up with less pain in her Rt upper trap. Pt leaves for [place] trip tomorrow. See measurements for cervical rotation in chart.    Personal Factors and Comorbidities Comorbidity 1;Comorbidity 2;Comorbidity 3+    Comorbidities lumbar fusion surgery L4-01 Aug 2019 ; cervical fusion C3-01 June 2020; osteoporosis    Examination-Activity Limitations Sit;Carry;Sleep;Lift;Other    Examination-Participation Restrictions Cleaning;Meal Prep;Driving;Laundry    Stability/Clinical Decision Making Stable/Uncomplicated    Rehab Potential Good    PT Frequency 2x / week    PT Duration 8 weeks    PT Treatment/Interventions ADLs/Self Care Home Management;Cryotherapy;Electrical Stimulation;Moist Heat;Ultrasound;Therapeutic exercise;Neuromuscular re-education;Manual techniques;Therapeutic activities;Patient/family education;Dry needling;Taping    PT Next Visit Plan assess response to DN and manual work.  Continue to address postural strength, cervical mobility and pain.  Pt will be in [place] after the next session and will return after.    PT Home Exercise Plan Access Code [email or web address]    Consulted and Agree with Plan of Care Patient             Patient will benefit from skilled therapeutic intervention in order to improve the following deficits and impairments:  Decreased range of motion, Increased fascial restricitons, Pain, Decreased activity tolerance, Decreased strength  Visit Diagnosis: Muscle weakness  (  generalized)  Cervicalgia     Problem List Patient Active Problem List   Diagnosis Date Noted   Degenerative scoliosis in adult patient 08/22/2019   Internal and external prolapsed hemorrhoids 09/04/2018    Desteni Piscopo, PTA 10/05/2021, 2:38 PM  [facility] @ [place], [place], [address] Phone: [phone]   Fax:  [phone]  Name: Zaniah Titterington MRN: [id number] Date of Birth: 1951-01-27

## 2021-10-21 ENCOUNTER — Other Ambulatory Visit: Payer: Self-pay

## 2021-10-21 ENCOUNTER — Ambulatory Visit: Payer: Medicare PPO

## 2021-10-21 DIAGNOSIS — M6281 Muscle weakness (generalized): Secondary | ICD-10-CM | POA: Diagnosis not present

## 2021-10-21 DIAGNOSIS — M542 Cervicalgia: Secondary | ICD-10-CM

## 2021-10-21 NOTE — Therapy (Signed)
[facility] @ [place], [place], [address] Phone: [phone]   Fax:  [phone]  Physical Therapy Treatment  Patient Details  Name: Audrey Powers MRN: [id number] Date of Birth: [birth year] Referring Provider (PT): Viona Gilmore NP   Encounter Date: 10/21/2021   PT End of Session - 10/21/21 1226     Visit Number 7    Date for PT Re-Evaluation 11/13/21    Authorization Type [employer]: 12 visits 9/23-11/18/22    Authorization - Visit Number 7    Authorization - Number of Visits 12    Progress Note Due on Visit 10    PT Start Time 1147    PT Stop Time 1227    PT Time Calculation (min) 40 min    Activity Tolerance Patient tolerated treatment well    Behavior During Therapy [facility] for tasks assessed/performed             Past Medical History:  Diagnosis Date   Anxiety    Arthritis    Bunion    DDD (degenerative disc disease), cervical    Degenerative scoliosis in adult patient 08/22/2019   Depression    Endometrial polyp    Fibrocystic breast changes    Fundic gland polyps of stomach, benign 08/2016   GERD (gastroesophageal reflux disease)    Headache, migraine    Hirsutism    Hyperlipidemia    IBS (irritable bowel syndrome)    Internal and external prolapsed hemorrhoids 09/04/2018   Iron deficiency anemia    Osteoarthritis    Osteoporosis    PONV (postoperative nausea and vomiting)    Tinnitus     Past Surgical History:  Procedure Laterality Date   CARPAL TUNNEL RELEASE Left 01/2021   CERVICAL FUSION  05/2020   COLONOSCOPY     DILATION AND CURETTAGE OF UTERUS  1995   x 2   EUS     HYSTEROSCOPY     LUMBAR FUSION  07/2019   and disc replacement   TUBAL LIGATION  1989   UPPER GASTROINTESTINAL ENDOSCOPY      There were no vitals filed for this visit.   Subjective Assessment - 10/21/21 1152     Subjective I am feeling much better.  50% overall improvement since the start of care.     Pertinent History Goes by "Audrey Powers" C3-6 fusion  06/19/20;  Lumbar fusion 07/2019  osteoporosis;  no MRI b/c of braces    Currently in Pain? No/denies                [facility] PT Assessment - 10/21/21 0001       AROM   Cervical - Right Side Bend 38    Cervical - Left Side Bend 40                           [facility] Adult PT Treatment/Exercise - 10/21/21 0001       Neck Exercises: Machines for Strengthening   UBE (Upper Arm Bike) L 1.5 3x3 with discussion of status      Neck Exercises: Stretches   Upper Trapezius Stretch 2 reps;20 seconds      Shoulder Exercises: Standing   External Rotation Strengthening;Both;Theraband;15 reps    Theraband Level (Shoulder External Rotation) Level 3 (Green)    Extension Strengthening;Both;10 reps;Theraband    Theraband Level (Shoulder Extension) Level 3 (Green)    Row Strengthening;Both;Theraband;15 reps    Theraband Level (Shoulder Row)  Level 3 (Green)      Manual Therapy   Manual Therapy Soft tissue mobilization;Myofascial release    Manual therapy comments Passive stretching to Rt upper trap    Soft tissue mobilization right cervical paraspinals, upper trap, levator scap, suboccipitals    Myofascial Release skilled palpation and monitoring and assessment with dry needling              Trigger Point Dry Needling - 10/21/21 0001     Consent Given? Yes    Muscles Treated Head and Neck Upper trapezius;Levator scapulae;Suboccipitals;Cervical multifidi    Dry Needling Comments bil    Upper Trapezius Response Twitch reponse elicited;Palpable increased muscle length    Suboccipitals Response Palpable increased muscle length    Levator Scapulae Response Palpable increased muscle length    Cervical multifidi Response Palpable increased muscle length                     PT Short Term Goals - 10/21/21 1208       PT SHORT TERM GOAL #2   Title The patient will report a 30% improvement in right neck/upper trap  region pain with sewing, reading; food prep, keyboarding      PT SHORT TERM GOAL #3   Title Improved right sidednding to 18 degrees and right rotation to 35 degrees needed for driving    Status Achieved               PT Long Term Goals - 10/21/21 1152       PT LONG TERM GOAL #1   Title The patient will be independent with a safe self progression of HEP    Status On-going      PT LONG TERM GOAL #2   Title The patient will report a 60% improvement in right neck pain with sewing, reading, food prep, keyboarding    Baseline 50% limitation    Status On-going                   Plan - 10/21/21 1201     Clinical Impression Statement Pt was traveling over the past 2 weeks.  Pt reports that she feels 50% overall improvement in symptoms since the start of care.  Pt is independent and compliant in HEP for postural strength and flexibility.  Pt with tension and trigger points in the neck and upper traps and had good response to DN and manual therapy today.  Pt will continue to benefit from skilled PT to address neck pain, postural strength and mobility.    PT Frequency 2x / week    PT Duration 8 weeks    PT Treatment/Interventions ADLs/Self Care Home Management;Cryotherapy;Electrical Stimulation;Moist Heat;Ultrasound;Therapeutic exercise;Neuromuscular re-education;Manual techniques;Therapeutic activities;Patient/family education;Dry needling;Taping    PT Next Visit Plan assess response to DN, continue manual, strength and flexiblity.    PT Home Exercise Plan Access Code [email or web address]    Consulted and Agree with Plan of Care Patient             Patient will benefit from skilled therapeutic intervention in order to improve the following deficits and impairments:  Decreased range of motion, Increased fascial restricitons, Pain, Decreased activity tolerance, Decreased strength  Visit Diagnosis: Muscle weakness (generalized)  Cervicalgia     Problem List Patient Active Problem  List   Diagnosis Date Noted   Degenerative scoliosis in adult patient 08/22/2019   Internal and external prolapsed hemorrhoids 09/04/2018   Sigurd Sos, PT 10/21/21 12:28 PM  [facility] @ [place], [place], [address] Phone: [phone]   Fax:  [phone]  Name: Audrey Powers MRN: [id number] Date of Birth: 04/08/1951

## 2021-10-23 ENCOUNTER — Ambulatory Visit: Payer: Medicare PPO | Admitting: Physical Therapy

## 2021-10-23 ENCOUNTER — Other Ambulatory Visit: Payer: Self-pay

## 2021-10-23 ENCOUNTER — Encounter: Payer: Self-pay | Admitting: Physical Therapy

## 2021-10-23 DIAGNOSIS — M542 Cervicalgia: Secondary | ICD-10-CM | POA: Diagnosis not present

## 2021-10-23 DIAGNOSIS — M6281 Muscle weakness (generalized): Secondary | ICD-10-CM

## 2021-10-23 NOTE — Therapy (Signed)
[facility] @ [place], [place], [address] Phone: [phone]   Fax:  [phone]  Physical Therapy Treatment  Patient Details  Name: Audrey Powers MRN: [id number] Date of Birth: [birth year] Referring Provider (PT): Viona Gilmore NP   Encounter Date: 10/23/2021   PT End of Session - 10/23/21 0806     Visit Number 8    Date for PT Re-Evaluation 11/13/21    Authorization Type [employer]: 12 visits 9/23-11/18/22    Authorization - Visit Number 8    Authorization - Number of Visits 12    Progress Note Due on Visit 10    PT Start Time 0805    PT Stop Time 2025    PT Time Calculation (min) 48 min    Activity Tolerance Patient tolerated treatment well    Behavior During Therapy [facility] for tasks assessed/performed             Past Medical History:  Diagnosis Date   Anxiety    Arthritis    Bunion    DDD (degenerative disc disease), cervical    Degenerative scoliosis in adult patient 08/22/2019   Depression    Endometrial polyp    Fibrocystic breast changes    Fundic gland polyps of stomach, benign 08/2016   GERD (gastroesophageal reflux disease)    Headache, migraine    Hirsutism    Hyperlipidemia    IBS (irritable bowel syndrome)    Internal and external prolapsed hemorrhoids 09/04/2018   Iron deficiency anemia    Osteoarthritis    Osteoporosis    PONV (postoperative nausea and vomiting)    Tinnitus     Past Surgical History:  Procedure Laterality Date   CARPAL TUNNEL RELEASE Left 01/2021   CERVICAL FUSION  05/2020   COLONOSCOPY     DILATION AND CURETTAGE OF UTERUS  1995   x 2   EUS     HYSTEROSCOPY     LUMBAR FUSION  07/2019   and disc replacement   TUBAL LIGATION  1989   UPPER GASTROINTESTINAL ENDOSCOPY      There were no vitals filed for this visit.   Subjective Assessment - 10/23/21 0808     Subjective Just returned from [place] trip. pt did very well, did her exercises, took  anti-inflammatory meds and not doing housework all helped.    Pertinent History Goes by "Shalaya" C3-6 fusion  06/19/20;  Lumbar fusion 07/2019  osteoporosis;  no MRI b/c of braces    Currently in Pain? No/denies                               [facility] Adult PT Treatment/Exercise - 10/23/21 0001       Therapeutic Activites    Therapeutic Activities Other Therapeutic Activities    Other Therapeutic Activities Carrying 10# aroung gym 3x, VC to relax upper traps      Neck Exercises: Machines for Strengthening   UBE (Upper Arm Bike) L 1.7 3x3 with PTA present to discuss status and trip      Neck Exercises: Stretches   Upper Trapezius Stretch 3 reps;Left;Right;20 seconds    Levator Stretch 3 reps;20 seconds   seated     Hand Exercises for Cervical Radiculopathy   Finger ABduction seated      Shoulder Exercises: Standing   External Rotation Strengthening;Both;Theraband;15 reps   2 sets   Theraband Level (Shoulder External Rotation) Level 3 (Green)  Extension Strengthening;Both;Theraband;20 reps    Theraband Level (Shoulder Extension) Level 3 (Green)    Row Strengthening;Both;Theraband;15 reps   2 sets   Theraband Level (Shoulder Row) Level 3 (Green)    Other Standing Exercises Wall pushups 2x10      Shoulder Exercises: ROM/Strengthening   Lat Pull --   1 1/2 plates 15x   Other ROM/Strengthening Exercises Seated 3 way  raise 1# 10x each      Moist Heat Therapy   Number Minutes Moist Heat 10 Minutes    Moist Heat Location Cervical                       PT Short Term Goals - 10/23/21 0839       PT SHORT TERM GOAL #2   Title The patient will report a 30% improvement in right neck/upper trap region pain with sewing, reading; food prep, keyboarding    Time 4    Period Weeks    Status Achieved   50%              PT Long Term Goals - 10/21/21 1152       PT LONG TERM GOAL #1   Title The patient will be independent with a safe self progression  of HEP    Status On-going      PT LONG TERM GOAL #2   Title The patient will report a 60% improvement in right neck pain with sewing, reading, food prep, keyboarding    Baseline 50% limitation    Status On-going                   Plan - 10/23/21 0813     Clinical Impression Statement Pt arrives with no pain this AM and just returning from successful [place] trip. Treatment focused on upper quarter strength and functional activity tolerance. pt had no adverse reactions to light loads today. Pt's tendency when carrying object is to elevate scapula. All STGs are now met.    Personal Factors and Comorbidities Comorbidity 1;Comorbidity 2;Comorbidity 3+    Comorbidities lumbar fusion surgery L4-01 Aug 2019 ; cervical fusion C3-01 June 2020; osteoporosis    Examination-Activity Limitations Sit;Carry;Sleep;Lift;Other    Examination-Participation Restrictions Cleaning;Meal Prep;Driving;Laundry    Stability/Clinical Decision Making Stable/Uncomplicated    Rehab Potential Good    PT Frequency 2x / week    PT Duration 8 weeks    PT Treatment/Interventions ADLs/Self Care Home Management;Cryotherapy;Electrical Stimulation;Moist Heat;Ultrasound;Therapeutic exercise;Neuromuscular re-education;Manual techniques;Therapeutic activities;Patient/family education;Dry needling;Taping    PT Next Visit Plan Dry needling next session: see how pt tolerated all the strengthening from todays session.    PT Home Exercise Plan Access Code [email or web address]    Consulted and Agree with Plan of Care Patient             Patient will benefit from skilled therapeutic intervention in order to improve the following deficits and impairments:  Decreased range of motion, Increased fascial restricitons, Pain, Decreased activity tolerance, Decreased strength  Visit Diagnosis: Muscle weakness (generalized)  Cervicalgia     Problem List Patient Active Problem List   Diagnosis Date Noted   Degenerative scoliosis in  adult patient 08/22/2019   Internal and external prolapsed hemorrhoids 09/04/2018    Nayellie Sanseverino, PTA 10/23/2021, 8:41 AM  [facility] @ [place], [place], [address] Phone: [phone]   Fax:  [phone]  Name: Dacia Capers MRN: [id number] Date of Birth: 01-Sep-1951

## 2021-10-28 ENCOUNTER — Ambulatory Visit: Payer: Medicare PPO | Attending: Student

## 2021-10-28 ENCOUNTER — Other Ambulatory Visit: Payer: Self-pay

## 2021-10-28 DIAGNOSIS — M542 Cervicalgia: Secondary | ICD-10-CM | POA: Insufficient documentation

## 2021-10-28 DIAGNOSIS — M6281 Muscle weakness (generalized): Secondary | ICD-10-CM | POA: Diagnosis not present

## 2021-10-28 NOTE — Therapy (Signed)
[facility] @ [place], [place], [address] Phone: [phone]   Fax:  [phone]  Physical Therapy Treatment  Patient Details  Name: Audrey Powers MRN: [id number] Date of Birth: May 12, 1951 Referring Provider (PT): Viona Gilmore NP   Encounter Date: 10/28/2021   PT End of Session - 10/28/21 1225     Visit Number 9    Date for PT Re-Evaluation 11/13/21    Authorization Type [employer]: 12 visits 9/23-11/18/22    Authorization - Visit Number 9    Authorization - Number of Visits 12    Progress Note Due on Visit 10    PT Start Time 1147    PT Stop Time 1227    PT Time Calculation (min) 40 min    Activity Tolerance Patient tolerated treatment well    Behavior During Therapy [facility] for tasks assessed/performed             Past Medical History:  Diagnosis Date   Anxiety    Arthritis    Bunion    DDD (degenerative disc disease), cervical    Degenerative scoliosis in adult patient 08/22/2019   Depression    Endometrial polyp    Fibrocystic breast changes    Fundic gland polyps of stomach, benign 08/2016   GERD (gastroesophageal reflux disease)    Headache, migraine    Hirsutism    Hyperlipidemia    IBS (irritable bowel syndrome)    Internal and external prolapsed hemorrhoids 09/04/2018   Iron deficiency anemia    Osteoarthritis    Osteoporosis    PONV (postoperative nausea and vomiting)    Tinnitus     Past Surgical History:  Procedure Laterality Date   CARPAL TUNNEL RELEASE Left 01/2021   CERVICAL FUSION  05/2020   COLONOSCOPY     DILATION AND CURETTAGE OF UTERUS  1995   x 2   EUS     HYSTEROSCOPY     LUMBAR FUSION  07/2019   and disc replacement   TUBAL LIGATION  1989   UPPER GASTROINTESTINAL ENDOSCOPY      There were no vitals filed for this visit.   Subjective Assessment - 10/28/21 1150     Subjective I think I hurt my back moving leaves yesterday. My neck was sore when I woke  up this morning.    Pertinent History Goes by "Audrey Powers" C3-6 fusion  06/19/20;  Lumbar fusion 07/2019  osteoporosis;  no MRI b/c of braces    Patient Stated Goals see if we can get rid of pain in right upper trap muscle and neck pain    Currently in Pain? Yes    Pain Score 2     Pain Location Neck    Pain Orientation Right    Pain Descriptors / Indicators Sore;Dull    Pain Type Chronic pain    Pain Onset More than a month ago    Pain Frequency Constant    Aggravating Factors  turning head, sleeping wrong, using arms    Pain Relieving Factors lying down on pillow, breaks, stretching                               [facility] Adult PT Treatment/Exercise - 10/28/21 0001       Neck Exercises: Machines for Strengthening   UBE (Upper Arm Bike) L 1.7 3x3 with PT present to discuss status and trip      Shoulder  Exercises: ROM/Strengthening   Other ROM/Strengthening Exercises Seated 3 way  raise 1# 10x each      Manual Therapy   Manual Therapy Soft tissue mobilization;Myofascial release    Manual therapy comments Passive stretching to Rt upper trap    Soft tissue mobilization right cervical paraspinals, upper trap, levator scap, suboccipitals              Trigger Point Dry Needling - 10/28/21 0001     Consent Given? Yes    Muscles Treated Head and Neck Upper trapezius;Levator scapulae;Suboccipitals;Cervical multifidi    Dry Needling Comments bil    Upper Trapezius Response Twitch reponse elicited;Palpable increased muscle length    Suboccipitals Response Palpable increased muscle length    Levator Scapulae Response Palpable increased muscle length    Cervical multifidi Response Palpable increased muscle length                     PT Short Term Goals - 10/28/21 1152       PT SHORT TERM GOAL #1   Title The patient will demonstrate compliance with a basic beginning HEP for postural strengthening    Status Achieved               PT Long Term Goals  - 10/28/21 1152       PT LONG TERM GOAL #1   Title The patient will be independent with a safe self progression of HEP    Time 8    Period Weeks    Status On-going      PT LONG TERM GOAL #2   Title The patient will report a 60% improvement in right neck pain with sewing, reading, food prep, keyboarding    Baseline 50% improvement                   Plan - 10/28/21 1201     Clinical Impression Statement Pt had flare-up of LBP after moving leaves in her yard yesterday.  Neck was stiff this morning but not a lot of pain. Pt reports that she feels 50% overall improvement in symptoms since the start of care.  Pt is independent and compliant in HEP for postural strength and flexibility.  Pt with tension and trigger points in the neck and upper traps and had good response to DN and manual therapy today.  Pt will continue to benefit from skilled PT to address neck pain, postural strength and mobility.    PT Frequency 2x / week    PT Duration 8 weeks    PT Treatment/Interventions ADLs/Self Care Home Management;Cryotherapy;Electrical Stimulation;Moist Heat;Ultrasound;Therapeutic exercise;Neuromuscular re-education;Manual techniques;Therapeutic activities;Patient/family education;Dry needling;Taping    PT Next Visit Plan postural strength, manual therapy    PT Home Exercise Plan Access Code [email or web address]    Consulted and Agree with Plan of Care Patient             Patient will benefit from skilled therapeutic intervention in order to improve the following deficits and impairments:  Decreased range of motion, Increased fascial restricitons, Pain, Decreased activity tolerance, Decreased strength  Visit Diagnosis: Muscle weakness (generalized)  Cervicalgia     Problem List Patient Active Problem List   Diagnosis Date Noted   Degenerative scoliosis in adult patient 08/22/2019   Internal and external prolapsed hemorrhoids 09/04/2018   Sigurd Sos, PT 10/28/21 12:29 PM   [place] @ [place], [place], [address] Phone: [phone]   Fax:  [phone]  Name: Audrey Powers MRN: [id number] Date of Birth: 1951-02-03

## 2021-10-30 ENCOUNTER — Encounter: Payer: Self-pay | Admitting: Physical Therapy

## 2021-10-30 ENCOUNTER — Other Ambulatory Visit: Payer: Self-pay

## 2021-10-30 ENCOUNTER — Ambulatory Visit: Payer: Medicare PPO | Admitting: Physical Therapy

## 2021-10-30 DIAGNOSIS — M6281 Muscle weakness (generalized): Secondary | ICD-10-CM | POA: Diagnosis not present

## 2021-10-30 DIAGNOSIS — M542 Cervicalgia: Secondary | ICD-10-CM

## 2021-10-30 NOTE — Therapy (Addendum)
[facility] @ [place], [place], [address] Phone: [phone]   Fax:  [phone]  Physical Therapy Treatment  Patient Details  Name: Audrey Powers MRN: [id number] Date of Birth: 1951/08/07 Referring Provider (PT): Viona Gilmore NP   Encounter Date: 10/30/2021 Progress Note Reporting Period 09/18/21 to 10/30/21  See note below for Objective Data and Assessment of Progress/Goals.    Sigurd Sos, PT 10/30/21 9:35 AM    PT End of Session - 10/30/21 0803     Visit Number 10    Date for PT Re-Evaluation 11/13/21    Authorization Type [employer]: 12 visits 9/23-11/18/22    Authorization - Visit Number 10    Authorization - Number of Visits 12    Progress Note Due on Visit 10    PT Start Time 0802    PT Stop Time 0841    PT Time Calculation (min) 39 min    Activity Tolerance Patient tolerated treatment well    Behavior During Therapy [facility] for tasks assessed/performed             Past Medical History:  Diagnosis Date   Anxiety    Arthritis    Bunion    DDD (degenerative disc disease), cervical    Degenerative scoliosis in adult patient 08/22/2019   Depression    Endometrial polyp    Fibrocystic breast changes    Fundic gland polyps of stomach, benign 08/2016   GERD (gastroesophageal reflux disease)    Headache, migraine    Hirsutism    Hyperlipidemia    IBS (irritable bowel syndrome)    Internal and external prolapsed hemorrhoids 09/04/2018   Iron deficiency anemia    Osteoarthritis    Osteoporosis    PONV (postoperative nausea and vomiting)    Tinnitus     Past Surgical History:  Procedure Laterality Date   CARPAL TUNNEL RELEASE Left 01/2021   CERVICAL FUSION  05/2020   COLONOSCOPY     DILATION AND CURETTAGE OF UTERUS  1995   x 2   EUS     HYSTEROSCOPY     LUMBAR FUSION  07/2019   and disc replacement   TUBAL LIGATION  1989   UPPER GASTROINTESTINAL ENDOSCOPY      There were  no vitals filed for this visit.   Subjective Assessment - 10/30/21 0804     Subjective Did well with the needling, just a little sore.    Pertinent History Goes by "[name]" C3-6 fusion  06/19/20;  Lumbar fusion 07/2019  osteoporosis;  no MRI b/c of braces    Currently in Pain? Yes    Pain Score 2     Pain Location Neck    Pain Orientation Right    Pain Descriptors / Indicators Dull;Sore    Multiple Pain Sites No                [facility] PT Assessment - 10/30/21 0001       Assessment   Medical Diagnosis cervicalgia    Referring Provider (PT) Viona Gilmore NP      Observation/Other Assessments   Focus on Therapeutic Outcomes (FOTO)  50 (at evaluation)      AROM   Cervical - Right Side Bend 38    Cervical - Left Side Bend 40      Strength   Overall Strength Comments General upper quadrant 4/5-4+/5  [facility] Adult PT Treatment/Exercise - 10/30/21 0001       Therapeutic Activites    Therapeutic Activities Other Therapeutic Activities;Lifting    Lifting 5# KB to first shelf 2x5    Other Therapeutic Activities Carrying 10# aroung gym 2x 2x, VC to relax upper traps      Neck Exercises: Machines for Strengthening   UBE (Upper Arm Bike) L 1.8 3x3      Neck Exercises: Stretches   Upper Trapezius Stretch 3 reps;Left;Right;20 seconds    Levator Stretch 3 reps;20 seconds   seated     Shoulder Exercises: Standing   Other Standing Exercises Wall pushups 2x10      Shoulder Exercises: ROM/Strengthening   Lat Pull --   1 plate + 10 extra # 4Q03   Cybex Row --   1 plate ( heavy) 47Q   Other ROM/Strengthening Exercises Seated 3 way  raise 1# 10x2 each                       PT Short Term Goals - 10/28/21 1152       PT SHORT TERM GOAL #1   Title The patient will demonstrate compliance with a basic beginning HEP for postural strengthening    Status Achieved               PT Long Term Goals - 10/30/21 2595       PT  LONG TERM GOAL #1   Title The patient will be independent with a safe self progression of HEP    Time 8    Period Weeks    Status On-going      PT LONG TERM GOAL #2   Title The patient will report a 60% improvement in right neck pain with sewing, reading, food prep, keyboarding    Baseline 50% improvement    Time 8    Period Weeks    Status On-going      PT LONG TERM GOAL #3   Title The patient will have improved right sidebending to 25 degrees and bil rotation to 40 degrees needed for driving    Baseline 38 Rt sidebending    Time 8    Period Weeks    Status On-going      PT LONG TERM GOAL #4   Title Upper quadrant strength grossly 4+/5 needed for carrying shopping bags, small luggage for traveling    Baseline 4/5 to 4+/5    Time 8    Period Weeks    Status On-going      PT LONG TERM GOAL #5   Title FOTO score improved to > 53%    Status On-going                   Plan - 10/30/21 0808     Clinical Impression Statement Pt arrives with mild cervical soreness which is typical for th AM per pt report, this improves as the day goes on and she performs her exercises/stretches. Pt demostrates carrying 10# box 2x around ortho gym with no increased neck pain, minor verbal cuing to depress upper traps. All loads were kept the same for todays treatmwnt but we increased total volume of work to simulate a busy day at home.    Personal Factors and Comorbidities Comorbidity 1;Comorbidity 2;Comorbidity 3+    Comorbidities lumbar fusion surgery L4-01 Aug 2019 ; cervical fusion C3-01 June 2020; osteoporosis    Examination-Activity Limitations Sit;Carry;Sleep;Lift;Other    Examination-Participation Restrictions Cleaning;Meal  Prep;Driving;Laundry    Stability/Clinical Decision Making Stable/Uncomplicated    Rehab Potential Good    PT Frequency 2x / week    PT Duration 8 weeks    PT Treatment/Interventions ADLs/Self Care Home Management;Cryotherapy;Electrical Stimulation;Moist  Heat;Ultrasound;Therapeutic exercise;Neuromuscular re-education;Manual techniques;Therapeutic activities;Patient/family education;Dry needling;Taping    PT Next Visit Plan postural strength, manual therapy: Pt has 2 more visits    PT Home Exercise Plan Access Code [email or web address]    [email or web address] and Agree with Plan of Care Patient             Patient will benefit from skilled therapeutic intervention in order to improve the following deficits and impairments:  Decreased range of motion, Increased fascial restricitons, Pain, Decreased activity tolerance, Decreased strength  Visit Diagnosis: Muscle weakness (generalized)  Cervicalgia     Problem List Patient Active Problem List   Diagnosis Date Noted   Degenerative scoliosis in adult patient 08/22/2019   Internal and external prolapsed hemorrhoids 09/04/2018    TAKACS,KELLY, PT 10/30/2021, 9:35 AM  [facility] @ [place], [place], [address] Phone: [phone]   Fax:  [phone]  Name: Audrey Powers MRN: [id number] Date of Birth: Feb 01, 1951

## 2021-11-04 ENCOUNTER — Ambulatory Visit: Payer: Medicare PPO

## 2021-11-04 ENCOUNTER — Other Ambulatory Visit: Payer: Self-pay

## 2021-11-04 DIAGNOSIS — M542 Cervicalgia: Secondary | ICD-10-CM | POA: Diagnosis not present

## 2021-11-04 DIAGNOSIS — M6281 Muscle weakness (generalized): Secondary | ICD-10-CM | POA: Diagnosis not present

## 2021-11-04 NOTE — Therapy (Signed)
[facility] @ [place], [place], [address] Phone: [phone]   Fax:  [phone]  Physical Therapy Treatment  Patient Details  Name: Audrey Powers MRN: [id number] Date of Birth: 1951/12/16 Referring Provider (PT): Viona Gilmore NP   Encounter Date: 11/04/2021   PT End of Session - 11/04/21 1233     Visit Number 11    Date for PT Re-Evaluation 11/13/21    Authorization Type [employer]: 12 visits 9/23-11/18/22    Authorization - Visit Number 11    Authorization - Number of Visits 12    Progress Note Due on Visit 69    PT Start Time 1148    PT Stop Time 1229    PT Time Calculation (min) 41 min    Activity Tolerance Patient tolerated treatment well    Behavior During Therapy [facility] for tasks assessed/performed             Past Medical History:  Diagnosis Date   Anxiety    Arthritis    Bunion    DDD (degenerative disc disease), cervical    Degenerative scoliosis in adult patient 08/22/2019   Depression    Endometrial polyp    Fibrocystic breast changes    Fundic gland polyps of stomach, benign 08/2016   GERD (gastroesophageal reflux disease)    Headache, migraine    Hirsutism    Hyperlipidemia    IBS (irritable bowel syndrome)    Internal and external prolapsed hemorrhoids 09/04/2018   Iron deficiency anemia    Osteoarthritis    Osteoporosis    PONV (postoperative nausea and vomiting)    Tinnitus     Past Surgical History:  Procedure Laterality Date   CARPAL TUNNEL RELEASE Left 01/2021   CERVICAL FUSION  05/2020   COLONOSCOPY     DILATION AND CURETTAGE OF UTERUS  1995   x 2   EUS     HYSTEROSCOPY     LUMBAR FUSION  07/2019   and disc replacement   TUBAL LIGATION  1989   UPPER GASTROINTESTINAL ENDOSCOPY      There were no vitals filed for this visit.   Subjective Assessment - 11/04/21 1151     Subjective I am feeling pretty good.  I feel 60% improvement since the start of care.     Pertinent History Goes by "[name]" C3-6 fusion  06/19/20;  Lumbar fusion 07/2019  osteoporosis;  no MRI b/c of braces    Currently in Pain? Yes    Pain Score 3     Pain Location Neck    Pain Orientation Right    Pain Descriptors / Indicators Sore;Dull    Pain Type Chronic pain    Pain Onset More than a month ago                [facility] PT Assessment - 11/04/21 0001       Assessment   Medical Diagnosis cervicalgia    Referring Provider (PT) Viona Gilmore NP      Observation/Other Assessments   Focus on Therapeutic Outcomes (FOTO)  50      AROM   Cervical - Right Side Bend 38    Cervical - Left Side Bend 40    Cervical - Right Rotation 50    Cervical - Left Rotation 60      Strength   Overall Strength Comments General upper quadrant 4/5-4+/5  [facility] Adult PT Treatment/Exercise - 11/04/21 0001       Neck Exercises: Machines for Strengthening   UBE (Upper Arm Bike) L 1.8 3x3- PT present to discuss progress      Neck Exercises: Stretches   Upper Trapezius Stretch Limitations cervical rotation stretch 3x20 seconds      Shoulder Exercises: ROM/Strengthening   Other ROM/Strengthening Exercises Seated 3 way  raise 2# 10x2 each      Manual Therapy   Manual Therapy Soft tissue mobilization;Myofascial release    Manual therapy comments skilled palpation and monitoring with DN    Soft tissue mobilization right cervical paraspinals, upper trap, levator scap, suboccipitals              Trigger Point Dry Needling - 11/04/21 0001     Consent Given? Yes    Muscles Treated Head and Neck Upper trapezius;Levator scapulae;Suboccipitals;Cervical multifidi    Dry Needling Comments bil    Upper Trapezius Response Twitch reponse elicited;Palpable increased muscle length    Suboccipitals Response Palpable increased muscle length    Levator Scapulae Response Palpable increased muscle length    Cervical multifidi Response Palpable  increased muscle length                     PT Short Term Goals - 10/28/21 1152       PT SHORT TERM GOAL #1   Title The patient will demonstrate compliance with a basic beginning HEP for postural strengthening    Status Achieved               PT Long Term Goals - 11/04/21 1154       PT LONG TERM GOAL #1   Title The patient will be independent with a safe self progression of HEP    Status On-going      PT LONG TERM GOAL #2   Title The patient will report a 80% improvement in right neck pain with sewing, reading, food prep, keyboarding    Baseline 60%    Status Revised    Target Date 11/13/21      PT LONG TERM GOAL #3   Title The patient will have improved right sidebending to 25 degrees and bil rotation to 40 degrees needed for driving    Baseline rotation is 50 and 60    Status Achieved      PT LONG TERM GOAL #4   Title Upper quadrant strength grossly 4+/5 needed for carrying shopping bags, small luggage for traveling    Baseline 4/5 to 4+/5    Status On-going    Target Date 11/13/21                   Plan - 11/04/21 1214     Clinical Impression Statement Pt is doing well with therapy including strength, flexibility and manual therapy.  Pt reports that she feels 60% overall improvement in symptoms since the start of care.  Cervical rotation is improved in both directions and is still limited to the Rt. Pt is independent and compliant in HEP for postural strength and flexibility.  Pt with tension and trigger points in the neck and upper traps and had good response to DN and manual therapy today.  Pt will continue to benefit from skilled PT to address neck pain, postural strength and mobility.    PT Frequency 2x / week    PT Duration 8 weeks    PT Treatment/Interventions ADLs/Self Care Home Management;Cryotherapy;Electrical Stimulation;Moist Heat;Ultrasound;Therapeutic exercise;Neuromuscular  re-education;Manual techniques;Therapeutic  activities;Patient/family education;Dry needling;Taping    PT Next Visit Plan postural strength, manual therapy: Pt has 1 more visits    PT Home Exercise Plan Access Code [email or web address]    [email or web address] and Agree with Plan of Care Patient             Patient will benefit from skilled therapeutic intervention in order to improve the following deficits and impairments:  Decreased range of motion, Increased fascial restricitons, Pain, Decreased activity tolerance, Decreased strength  Visit Diagnosis: Muscle weakness (generalized)  Cervicalgia     Problem List Patient Active Problem List   Diagnosis Date Noted   Degenerative scoliosis in adult patient 08/22/2019   Internal and external prolapsed hemorrhoids 09/04/2018   Sigurd Sos, PT 11/04/21 12:41 PM   [facility] @ [place], [place], [address] Phone: [phone]   Fax:  [phone]  Name: Noheli Melder MRN: [id number] Date of Birth: [birth year]

## 2021-11-06 ENCOUNTER — Ambulatory Visit: Payer: Medicare PPO | Admitting: Physical Therapy

## 2021-11-06 ENCOUNTER — Other Ambulatory Visit: Payer: Self-pay

## 2021-11-06 ENCOUNTER — Encounter: Payer: Self-pay | Admitting: Physical Therapy

## 2021-11-06 DIAGNOSIS — M6281 Muscle weakness (generalized): Secondary | ICD-10-CM | POA: Diagnosis not present

## 2021-11-06 DIAGNOSIS — M542 Cervicalgia: Secondary | ICD-10-CM

## 2021-11-06 NOTE — Therapy (Addendum)
[facility] @ [facility] [address] [place], [place], [address] Phone: [phone]   Fax:  [phone]  Physical Therapy Treatment  Patient Details  Name: Audrey Powers MRN: [id number] Date of Birth: 06-11-1951 Referring Provider (PT): Val Eagle NP   Encounter Date: 11/06/2021   PT End of Session - 11/06/21 0808     Visit Number 12    Date for PT Re-Evaluation 11/13/21    Authorization Type [employer]: 12 visits 9/23-11/18/22    Authorization - Visit Number 12    Authorization - Number of Visits 12    Progress Note Due on Visit 20    PT Start Time 0806    PT Stop Time 0844    PT Time Calculation (min) 38 min    Activity Tolerance Patient tolerated treatment well    Behavior During Therapy [facility] for tasks assessed/performed             Past Medical History:  Diagnosis Date   Anxiety    Arthritis    Bunion    DDD (degenerative disc disease), cervical    Degenerative scoliosis in adult patient 08/22/2019   Depression    Endometrial polyp    Fibrocystic breast changes    Fundic gland polyps of stomach, benign 08/2016   GERD (gastroesophageal reflux disease)    Headache, migraine    Hirsutism    Hyperlipidemia    IBS (irritable bowel syndrome)    Internal and external prolapsed hemorrhoids 09/04/2018   Iron deficiency anemia    Osteoarthritis    Osteoporosis    PONV (postoperative nausea and vomiting)    Tinnitus     Past Surgical History:  Procedure Laterality Date   CARPAL TUNNEL RELEASE Left 01/2021   CERVICAL FUSION  05/2020   COLONOSCOPY     DILATION AND CURETTAGE OF UTERUS  1995   x 2   EUS     HYSTEROSCOPY     LUMBAR FUSION  07/2019   and disc replacement   TUBAL LIGATION  1989   UPPER GASTROINTESTINAL ENDOSCOPY      There were no vitals filed for this visit.   Subjective Assessment - 11/06/21 0812     Subjective Since today is my 12th visit we will stop today and I will continue with  my HEP and stretching.    Pertinent History Goes by "[place]" C3-6 fusion  06/19/20;  Lumbar fusion 07/2019  osteoporosis;  no MRI b/c of braces    Currently in Pain? Yes    Pain Score 1     Pain Location Neck    Pain Orientation Right    Pain Descriptors / Indicators Dull    Aggravating Factors  Sore in the AM    Pain Relieving Factors Stretching , needling, heat    Multiple Pain Sites No                [facility] PT Assessment - 11/06/21 0001       Assessment   Medical Diagnosis cervicalgia    Referring Provider (PT) Val Eagle NP      Observation/Other Assessments   Focus on Therapeutic Outcomes (FOTO)  52      AROM   Cervical - Right Side Bend 38    Cervical - Left Side Bend 40    Cervical - Right Rotation 50    Cervical - Left Rotation 60      Strength   Overall Strength Comments General upper quadrant 4/5-4+/5  [facility] Adult PT Treatment/Exercise - 11/06/21 0001       Therapeutic Activites    Lifting 5# KB to first shelf 3x5    Other Therapeutic Activities Carrying 12# aroung gym 4x with short break in between, VC to relax upper traps      Neck Exercises: Machines for Strengthening   UBE (Upper Arm Bike) L 1.8 3x3- PTA present to discuss progress      Neck Exercises: Stretches   Upper Trapezius Stretch 3 reps;Left;Right;20 seconds    Levator Stretch 3 reps;20 seconds   seated     Shoulder Exercises: ROM/Strengthening   Cybex Row --   1 plate ( heavy) 71I   Other ROM/Strengthening Exercises Seated 3 way  raise 2# 10x2 each                       PT Short Term Goals - 10/28/21 1152       PT SHORT TERM GOAL #1   Title The patient will demonstrate compliance with a basic beginning HEP for postural strengthening    Status Achieved               PT Long Term Goals - 11/06/21 0835       PT LONG TERM GOAL #1   Title The patient will be independent with a safe self progression of HEP    Time 8     Period Weeks    Status Achieved      PT LONG TERM GOAL #2   Title The patient will report a 80% improvement in right neck pain with sewing, reading, food prep, keyboarding    Baseline 60%    Time 8    Period Weeks    Status Not Met      PT LONG TERM GOAL #3   Title The patient will have improved right sidebending to 25 degrees and bil rotation to 40 degrees needed for driving    Baseline rotation is 50 and 60    Time 8    Period Weeks    Status Achieved      PT LONG TERM GOAL #4   Title Upper quadrant strength grossly 4+/5 needed for carrying shopping bags, small luggage for traveling    Baseline 4/5 to 4+/5    Time 8    Period Weeks    Status Not Met      PT LONG TERM GOAL #5   Title FOTO score improved to > 53%    Time 8    Period Weeks    Status Not Met   52                  Plan - 11/06/21 0810     Clinical Impression Statement Today is pt's last authorizied PT visit, pt reports she is any where from 60% up to 75% improved, depending "on the day." Pt is independent and compliant with HEP. Pt remains slightly limited on the right. Long term goals were partially met but all improved since initial eval.    Personal Factors and Comorbidities Comorbidity 1;Comorbidity 2;Comorbidity 3+    Comorbidities lumbar fusion surgery L4-01 Aug 2019 ; cervical fusion C3-01 June 2020; osteoporosis    Examination-Participation Restrictions Cleaning;Meal Prep;Driving;Laundry    Stability/Clinical Decision Making Stable/Uncomplicated    Rehab Potential Good    PT Frequency 2x / week    PT Duration 8 weeks    PT Treatment/Interventions ADLs/Self Care Home Management;Cryotherapy;Electrical Stimulation;Moist Heat;Ultrasound;Therapeutic exercise;Neuromuscular  re-education;Manual techniques;Therapeutic activities;Patient/family education;Dry needling;Taping    PT Next Visit Plan Discharge to HEP    PT Home Exercise Plan Access Code [email or web address]    Consulted and Agree with Plan of Care  Patient             Patient will benefit from skilled therapeutic intervention in order to improve the following deficits and impairments:  Decreased range of motion, Increased fascial restricitons, Pain, Decreased activity tolerance, Decreased strength  Visit Diagnosis: Muscle weakness (generalized)  Cervicalgia     Problem List Patient Active Problem List   Diagnosis Date Noted   Degenerative scoliosis in adult patient 08/22/2019   Internal and external prolapsed hemorrhoids 09/04/2018    Myrene Galas PTA  11/06/21 9:36 AM  11/06/2021, 9:35 AM Discharge summary entered in error.  Pt has 2 remaining visits approved and will attend 2 sessions this week.  Sigurd Sos, PT 11/09/21 9:18 AM   Sigurd Sos, PT 11/06/21 6:01 PM   [facility] @ [place], [place], [address] Phone: [phone]   Fax:  [phone]  Name: Leonor Darnell MRN: [id number] Date of Birth: [birth year]

## 2021-11-09 ENCOUNTER — Ambulatory Visit: Payer: Medicare PPO | Admitting: Physical Therapy

## 2021-11-09 ENCOUNTER — Other Ambulatory Visit: Payer: Self-pay

## 2021-11-09 ENCOUNTER — Encounter: Payer: Self-pay | Admitting: Physical Therapy

## 2021-11-09 DIAGNOSIS — M542 Cervicalgia: Secondary | ICD-10-CM

## 2021-11-09 DIAGNOSIS — M6281 Muscle weakness (generalized): Secondary | ICD-10-CM

## 2021-11-09 NOTE — Therapy (Signed)
[facility] @ [place], [place], [address] Phone: [phone]   Fax:  [phone]  Physical Therapy Treatment  Patient Details  Name: Marea Reasner MRN: [id number] Date of Birth: Oct 26, 1951 Referring Provider (PT): Viona Gilmore NP   Encounter Date: 11/09/2021   PT End of Session - 11/09/21 0808     Visit Number 13    Date for PT Re-Evaluation 11/13/21    Authorization Type [employer]: 12 visits 9/23-11/18/22    Authorization - Visit Number 85    Authorization - Number of Visits 14    Progress Note Due on Visit 1    PT Start Time 0805   pt late   PT Stop Time 0841    PT Time Calculation (min) 36 min    Activity Tolerance Patient tolerated treatment well    Behavior During Therapy [facility] for tasks assessed/performed             Past Medical History:  Diagnosis Date   Anxiety    Arthritis    Bunion    DDD (degenerative disc disease), cervical    Degenerative scoliosis in adult patient 08/22/2019   Depression    Endometrial polyp    Fibrocystic breast changes    Fundic gland polyps of stomach, benign 08/2016   GERD (gastroesophageal reflux disease)    Headache, migraine    Hirsutism    Hyperlipidemia    IBS (irritable bowel syndrome)    Internal and external prolapsed hemorrhoids 09/04/2018   Iron deficiency anemia    Osteoarthritis    Osteoporosis    PONV (postoperative nausea and vomiting)    Tinnitus     Past Surgical History:  Procedure Laterality Date   CARPAL TUNNEL RELEASE Left 01/2021   CERVICAL FUSION  05/2020   COLONOSCOPY     DILATION AND CURETTAGE OF UTERUS  1995   x 2   EUS     HYSTEROSCOPY     LUMBAR FUSION  07/2019   and disc replacement   TUBAL LIGATION  1989   UPPER GASTROINTESTINAL ENDOSCOPY      There were no vitals filed for this visit.   Subjective Assessment - 11/09/21 0810     Subjective Pt had last 2 visits approved and is able to to complete her  final two visits. She arrives with very minimal pain and stiffness.    Pertinent History Goes by "Cristela" C3-6 fusion  06/19/20;  Lumbar fusion 07/2019  osteoporosis;  no MRI b/c of braces    Currently in Pain? Yes    Pain Score 2     Pain Location Neck    Pain Orientation Right    Pain Descriptors / Indicators Dull    Multiple Pain Sites No                               [facility] Adult PT Treatment/Exercise - 11/09/21 0001       Therapeutic Activites    Lifting 5# KB to first shelf 3x5    Other Therapeutic Activities Carrying 13# aroung gym 5x with short break in between, VC to relax upper traps      Neck Exercises: Machines for Strengthening   UBE (Upper Arm Bike) L2 3x3 with PTA present      Neck Exercises: Stretches   Upper Trapezius Stretch 3 reps;Left;Right;20 seconds    Levator Stretch 3 reps;20 seconds   seated  Shoulder Exercises: Standing   External Rotation Strengthening;Both;20 reps   2x10 red band     Shoulder Exercises: ROM/Strengthening   Lat Pull --   1 1/2 plates 2x10   Cybex Row --   1 plate 10x ( 2x5 today) "less heavy"   Wall Pushups --   2x10   Other ROM/Strengthening Exercises Seated 3 way  raise 2# 10x2 each                       PT Short Term Goals - 10/28/21 1152       PT SHORT TERM GOAL #1   Title The patient will demonstrate compliance with a basic beginning HEP for postural strengthening    Status Achieved               PT Long Term Goals - 11/06/21 0835       PT LONG TERM GOAL #1   Title The patient will be independent with a safe self progression of HEP    Time 8    Period Weeks    Status Achieved      PT LONG TERM GOAL #2   Title The patient will report a 80% improvement in right neck pain with sewing, reading, food prep, keyboarding    Baseline 60%    Time 8    Period Weeks    Status Not Met      PT LONG TERM GOAL #3   Title The patient will have improved right sidebending to 25 degrees  and bil rotation to 40 degrees needed for driving    Baseline rotation is 50 and 60    Time 8    Period Weeks    Status Achieved      PT LONG TERM GOAL #4   Title Upper quadrant strength grossly 4+/5 needed for carrying shopping bags, small luggage for traveling    Baseline 4/5 to 4+/5    Time 8    Period Weeks    Status Not Met      PT LONG TERM GOAL #5   Title FOTO score improved to > 53%    Time 8    Period Weeks    Status Not Met   52                  Plan - 11/09/21 0808     Clinical Impression Statement Pt received approval for her last 2 remaining PT sessions. She continues to have mild nck stiffness and soreness mainly in the AM but can resolve most of it with stretching. Pt reports her shoulder muscles feel "stronger." Pt reports the 1 plate on the row is "less heavy now." Pt able to complete all resistive exercises without increasing neck pain.    Personal Factors and Comorbidities Comorbidity 1;Comorbidity 2;Comorbidity 3+    Comorbidities lumbar fusion surgery L4-01 Aug 2019 ; cervical fusion C3-01 June 2020; osteoporosis    Examination-Activity Limitations Sit;Carry;Sleep;Lift;Other    Examination-Participation Restrictions Cleaning;Meal Prep;Driving;Laundry    Stability/Clinical Decision Making Stable/Uncomplicated    Rehab Potential Good    PT Frequency 2x / week    PT Duration 8 weeks    PT Treatment/Interventions ADLs/Self Care Home Management;Cryotherapy;Electrical Stimulation;Moist Heat;Ultrasound;Therapeutic exercise;Neuromuscular re-education;Manual techniques;Therapeutic activities;Patient/family education;Dry needling;Taping    PT Next Visit Plan DC next visit    PT Home Exercise Plan Access Code [email or web address]    Consulted and Agree with Plan of Care Patient  Patient will benefit from skilled therapeutic intervention in order to improve the following deficits and impairments:  Decreased range of motion, Increased fascial restricitons,  Pain, Decreased activity tolerance, Decreased strength  Visit Diagnosis: Muscle weakness (generalized)  Cervicalgia     Problem List Patient Active Problem List   Diagnosis Date Noted   Degenerative scoliosis in adult patient 08/22/2019   Internal and external prolapsed hemorrhoids 09/04/2018    Aminta Sakurai, PTA 11/09/2021, 8:41 AM  [facility] @ [place], [place], [address] Phone: [phone]   Fax:  [phone]  Name: Javonda Suh MRN: [id number] Date of Birth: 25-Mar-1951

## 2021-11-11 ENCOUNTER — Other Ambulatory Visit: Payer: Self-pay

## 2021-11-11 ENCOUNTER — Ambulatory Visit: Payer: Medicare PPO

## 2021-11-11 DIAGNOSIS — M542 Cervicalgia: Secondary | ICD-10-CM

## 2021-11-11 DIAGNOSIS — M6281 Muscle weakness (generalized): Secondary | ICD-10-CM | POA: Diagnosis not present

## 2021-11-11 NOTE — Therapy (Signed)
[facility] @ [place], [place], [address] Phone: [phone]   Fax:  [phone]  Physical Therapy Treatment  Patient Details  Name: Audrey Powers MRN: [id number] Date of Birth: [birth year] Referring Provider (PT): Viona Gilmore NP   Encounter Date: 11/11/2021   PT End of Session - 11/11/21 1222     Visit Number 14    Date for PT Re-Evaluation 11/13/21    Authorization Type [employer]: 12 visits 9/23-11/18/22    Authorization - Visit Number 25    Authorization - Number of Visits 14    PT Start Time 1147    PT Stop Time 8250    PT Time Calculation (min) 37 min    Activity Tolerance Patient tolerated treatment well    Behavior During Therapy [facility] for tasks assessed/performed             Past Medical History:  Diagnosis Date   Anxiety    Arthritis    Bunion    DDD (degenerative disc disease), cervical    Degenerative scoliosis in adult patient 08/22/2019   Depression    Endometrial polyp    Fibrocystic breast changes    Fundic gland polyps of stomach, benign 08/2016   GERD (gastroesophageal reflux disease)    Headache, migraine    Hirsutism    Hyperlipidemia    IBS (irritable bowel syndrome)    Internal and external prolapsed hemorrhoids 09/04/2018   Iron deficiency anemia    Osteoarthritis    Osteoporosis    PONV (postoperative nausea and vomiting)    Tinnitus     Past Surgical History:  Procedure Laterality Date   CARPAL TUNNEL RELEASE Left 01/2021   CERVICAL FUSION  05/2020   COLONOSCOPY     DILATION AND CURETTAGE OF UTERUS  1995   x 2   EUS     HYSTEROSCOPY     LUMBAR FUSION  07/2019   and disc replacement   TUBAL LIGATION  1989   UPPER GASTROINTESTINAL ENDOSCOPY      There were no vitals filed for this visit.   Subjective Assessment - 11/11/21 1146     Subjective Ready to D/C today.    Pertinent History Goes by "Emmry" C3-6 fusion  06/19/20;  Lumbar fusion 07/2019   osteoporosis;  no MRI b/c of braces    Patient Stated Goals see if we can get rid of pain in right upper trap muscle and neck pain    Currently in Pain? Yes    Pain Score 2     Pain Location Neck    Pain Orientation Right    Pain Descriptors / Indicators Dull    Pain Type Chronic pain    Pain Onset More than a month ago    Pain Frequency Constant    Aggravating Factors  depends on what I do, sore in the morning    Pain Relieving Factors stretching, needling, heat                [facility] PT Assessment - 11/11/21 0001       Assessment   Medical Diagnosis cervicalgia    Referring Provider (PT) Viona Gilmore NP      Observation/Other Assessments   Focus on Therapeutic Outcomes (FOTO)  52      AROM   Cervical - Right Side Bend 38    Cervical - Left Side Bend 40    Cervical - Right Rotation 50    Cervical - Left  Rotation 60      Strength   Overall Strength Comments General upper quadrant 4/5-4+/5                           [facility] Adult PT Treatment/Exercise - 11/11/21 0001       Neck Exercises: Machines for Strengthening   UBE (Upper Arm Bike) L2 3x3 with PT present      Shoulder Exercises: ROM/Strengthening   Other ROM/Strengthening Exercises Seated 3 way  raise 2# 10x2 each      Manual Therapy   Manual Therapy Soft tissue mobilization;Myofascial release    Manual therapy comments skilled palpation and monitoring with DN    Soft tissue mobilization right cervical paraspinals, upper trap, levator scap, suboccipitals              Trigger Point Dry Needling - 11/11/21 0001     Consent Given? Yes    Muscles Treated Head and Neck Upper trapezius;Levator scapulae;Suboccipitals;Cervical multifidi    Dry Needling Comments bil    Upper Trapezius Response Twitch reponse elicited;Palpable increased muscle length    Suboccipitals Response Palpable increased muscle length    Levator Scapulae Response Palpable increased muscle length    Cervical multifidi  Response Palpable increased muscle length                     PT Short Term Goals - 10/28/21 1152       PT SHORT TERM GOAL #1   Title The patient will demonstrate compliance with a basic beginning HEP for postural strengthening    Status Achieved               PT Long Term Goals - 11/11/21 1154       PT LONG TERM GOAL #1   Title The patient will be independent with a safe self progression of HEP    Status Achieved      PT LONG TERM GOAL #2   Title The patient will report a 80% improvement in right neck pain with sewing, reading, food prep, keyboarding    Baseline 60-70%    Status Partially Met      PT LONG TERM GOAL #3   Title The patient will have improved right sidebending to 25 degrees and bil rotation to 40 degrees needed for driving    Baseline rotation is 50 and 60    Status Achieved      PT LONG TERM GOAL #4   Title Upper quadrant strength grossly 4+/5 needed for carrying shopping bags, small luggage for traveling    Baseline 4/5 to 4+/5    Status Partially Met      PT LONG TERM GOAL #5   Title FOTO score improved to > 53%    Baseline 52    Status Partially Met                   Plan - 11/11/21 1225     Clinical Impression Statement Pt is ready for D/C to HEP.  Pt reports 60-70% overall improvement in symptoms since the start of care.  Pt continues to work on her cervical A/ROM and upper quarter strength with her HEP and continued progress is expected.  Pt with tension in neck and upper traps today with DN although overall improved since the start of care.  PT will D/C to HEP today.    PT Next Visit Plan D/C PT to HEP  PT Home Exercise Plan Access Code [email or web address]    Consulted and Agree with Plan of Care Patient             Patient will benefit from skilled therapeutic intervention in order to improve the following deficits and impairments:     Visit Diagnosis: Muscle weakness (generalized)  Cervicalgia     Problem  List Patient Active Problem List   Diagnosis Date Noted   Degenerative scoliosis in adult patient 08/22/2019   Internal and external prolapsed hemorrhoids 09/04/2018  PHYSICAL THERAPY DISCHARGE SUMMARY  Visits from Start of Care: 14  Current functional level related to goals / functional outcomes: See above for current status.    Remaining deficits: See above.  Pt with intermittent pain.    Education / Equipment: HEP   Patient agrees to discharge. Patient goals were partially met. Patient is being discharged due to being pleased with the current functional level.  Sigurd Sos, PT 11/11/21 12:27 PM   [facility] @ [place], [place], [address] Phone: [phone]   Fax:  [phone]  Name: Teressa Mcglocklin MRN: [id number] Date of Birth: 09-25-1951

## 2021-11-13 ENCOUNTER — Encounter: Payer: Medicare PPO | Admitting: Physical Therapy

## 2021-11-17 ENCOUNTER — Other Ambulatory Visit: Payer: Self-pay | Admitting: Family Medicine

## 2021-11-17 DIAGNOSIS — Z1231 Encounter for screening mammogram for malignant neoplasm of breast: Secondary | ICD-10-CM

## 2021-11-26 DIAGNOSIS — M810 Age-related osteoporosis without current pathological fracture: Secondary | ICD-10-CM | POA: Diagnosis not present

## 2021-12-01 DIAGNOSIS — I10 Essential (primary) hypertension: Secondary | ICD-10-CM | POA: Diagnosis not present

## 2021-12-01 DIAGNOSIS — M542 Cervicalgia: Secondary | ICD-10-CM | POA: Diagnosis not present

## 2021-12-08 DIAGNOSIS — Z6821 Body mass index (BMI) 21.0-21.9, adult: Secondary | ICD-10-CM | POA: Diagnosis not present

## 2021-12-08 DIAGNOSIS — Z8639 Personal history of other endocrine, nutritional and metabolic disease: Secondary | ICD-10-CM | POA: Diagnosis not present

## 2021-12-08 DIAGNOSIS — M810 Age-related osteoporosis without current pathological fracture: Secondary | ICD-10-CM | POA: Diagnosis not present

## 2021-12-22 ENCOUNTER — Ambulatory Visit
Admission: RE | Admit: 2021-12-22 | Discharge: 2021-12-22 | Disposition: A | Discharge status: Home / Self Care | Payer: Medicare PPO | Source: Ambulatory Visit | Attending: Family Medicine | Admitting: Family Medicine

## 2021-12-22 ENCOUNTER — Other Ambulatory Visit: Payer: Self-pay

## 2021-12-22 DIAGNOSIS — Z1231 Encounter for screening mammogram for malignant neoplasm of breast: Secondary | ICD-10-CM

## 2022-01-26 DIAGNOSIS — K219 Gastro-esophageal reflux disease without esophagitis: Secondary | ICD-10-CM | POA: Diagnosis not present

## 2022-01-26 DIAGNOSIS — K649 Unspecified hemorrhoids: Secondary | ICD-10-CM | POA: Diagnosis not present

## 2022-01-26 DIAGNOSIS — F338 Other recurrent depressive disorders: Secondary | ICD-10-CM | POA: Diagnosis not present

## 2022-03-04 DIAGNOSIS — M79645 Pain in left finger(s): Secondary | ICD-10-CM | POA: Diagnosis not present

## 2022-03-04 DIAGNOSIS — M13842 Other specified arthritis, left hand: Secondary | ICD-10-CM | POA: Diagnosis not present

## 2022-05-06 DIAGNOSIS — L918 Other hypertrophic disorders of the skin: Secondary | ICD-10-CM | POA: Diagnosis not present

## 2022-05-06 DIAGNOSIS — L821 Other seborrheic keratosis: Secondary | ICD-10-CM | POA: Diagnosis not present

## 2022-05-06 DIAGNOSIS — L819 Disorder of pigmentation, unspecified: Secondary | ICD-10-CM | POA: Diagnosis not present

## 2022-05-06 DIAGNOSIS — L299 Pruritus, unspecified: Secondary | ICD-10-CM | POA: Diagnosis not present

## 2022-05-06 DIAGNOSIS — D2272 Melanocytic nevi of left lower limb, including hip: Secondary | ICD-10-CM | POA: Diagnosis not present

## 2022-05-06 DIAGNOSIS — D2261 Melanocytic nevi of right upper limb, including shoulder: Secondary | ICD-10-CM | POA: Diagnosis not present

## 2022-05-25 DIAGNOSIS — H52203 Unspecified astigmatism, bilateral: Secondary | ICD-10-CM | POA: Diagnosis not present

## 2022-05-25 DIAGNOSIS — H2513 Age-related nuclear cataract, bilateral: Secondary | ICD-10-CM | POA: Diagnosis not present

## 2022-06-08 DIAGNOSIS — M810 Age-related osteoporosis without current pathological fracture: Secondary | ICD-10-CM | POA: Diagnosis not present

## 2022-06-08 DIAGNOSIS — Z8639 Personal history of other endocrine, nutritional and metabolic disease: Secondary | ICD-10-CM | POA: Diagnosis not present

## 2022-06-15 DIAGNOSIS — M810 Age-related osteoporosis without current pathological fracture: Secondary | ICD-10-CM | POA: Diagnosis not present

## 2022-06-15 DIAGNOSIS — Z8639 Personal history of other endocrine, nutritional and metabolic disease: Secondary | ICD-10-CM | POA: Diagnosis not present

## 2022-07-09 DIAGNOSIS — H6122 Impacted cerumen, left ear: Secondary | ICD-10-CM | POA: Diagnosis not present

## 2022-07-21 DIAGNOSIS — G43009 Migraine without aura, not intractable, without status migrainosus: Secondary | ICD-10-CM | POA: Diagnosis not present

## 2022-07-21 DIAGNOSIS — R252 Cramp and spasm: Secondary | ICD-10-CM | POA: Diagnosis not present

## 2022-07-21 DIAGNOSIS — F40243 Fear of flying: Secondary | ICD-10-CM | POA: Diagnosis not present

## 2022-07-21 DIAGNOSIS — M810 Age-related osteoporosis without current pathological fracture: Secondary | ICD-10-CM | POA: Diagnosis not present

## 2022-07-21 DIAGNOSIS — R42 Dizziness and giddiness: Secondary | ICD-10-CM | POA: Diagnosis not present

## 2022-07-21 DIAGNOSIS — K219 Gastro-esophageal reflux disease without esophagitis: Secondary | ICD-10-CM | POA: Diagnosis not present

## 2022-07-21 DIAGNOSIS — E785 Hyperlipidemia, unspecified: Secondary | ICD-10-CM | POA: Diagnosis not present

## 2022-07-21 DIAGNOSIS — F338 Other recurrent depressive disorders: Secondary | ICD-10-CM | POA: Diagnosis not present

## 2022-07-21 DIAGNOSIS — Z0000 Encounter for general adult medical examination without abnormal findings: Secondary | ICD-10-CM | POA: Diagnosis not present

## 2022-08-12 ENCOUNTER — Other Ambulatory Visit: Payer: Self-pay | Admitting: Home Modifications

## 2022-08-12 DIAGNOSIS — M810 Age-related osteoporosis without current pathological fracture: Secondary | ICD-10-CM

## 2022-09-16 DIAGNOSIS — M25511 Pain in right shoulder: Secondary | ICD-10-CM | POA: Diagnosis not present

## 2022-09-16 DIAGNOSIS — M7541 Impingement syndrome of right shoulder: Secondary | ICD-10-CM | POA: Diagnosis not present

## 2022-10-20 DIAGNOSIS — M25511 Pain in right shoulder: Secondary | ICD-10-CM | POA: Diagnosis not present

## 2022-11-09 ENCOUNTER — Other Ambulatory Visit: Payer: Self-pay | Admitting: Family Medicine

## 2022-11-09 DIAGNOSIS — Z1231 Encounter for screening mammogram for malignant neoplasm of breast: Secondary | ICD-10-CM

## 2022-12-23 ENCOUNTER — Ambulatory Visit
Admission: RE | Admit: 2022-12-23 | Discharge: 2022-12-23 | Disposition: A | Discharge status: Home / Self Care | Payer: Medicare PPO | Source: Ambulatory Visit

## 2022-12-23 DIAGNOSIS — Z1231 Encounter for screening mammogram for malignant neoplasm of breast: Secondary | ICD-10-CM

## 2023-01-11 DIAGNOSIS — M533 Sacrococcygeal disorders, not elsewhere classified: Secondary | ICD-10-CM | POA: Diagnosis not present

## 2023-01-17 ENCOUNTER — Encounter: Payer: Self-pay | Admitting: Rehabilitative and Restorative Service Providers"

## 2023-01-17 ENCOUNTER — Ambulatory Visit: Payer: Medicare PPO | Attending: Family Medicine | Admitting: Rehabilitative and Restorative Service Providers"

## 2023-01-17 ENCOUNTER — Other Ambulatory Visit: Payer: Self-pay

## 2023-01-17 DIAGNOSIS — M6281 Muscle weakness (generalized): Secondary | ICD-10-CM | POA: Diagnosis not present

## 2023-01-17 DIAGNOSIS — R2689 Other abnormalities of gait and mobility: Secondary | ICD-10-CM | POA: Insufficient documentation

## 2023-01-17 DIAGNOSIS — M533 Sacrococcygeal disorders, not elsewhere classified: Secondary | ICD-10-CM

## 2023-01-17 NOTE — Therapy (Signed)
OUTPATIENT PHYSICAL THERAPY  EVALUATION   Patient Name: Audrey Powers MRN: [id number] DOB:Jun 27, 1951, 72 y.o., female Today's Date: 01/17/2023  END OF SESSION:  PT End of Session - 01/17/23 1020     Visit Number 1    Date for PT Re-Evaluation 03/11/23    Authorization Type Medicare [employer]    Authorization Time Period auth requested    PT Start Time 1014    PT Stop Time 1055    PT Time Calculation (min) 41 min    Activity Tolerance Patient tolerated treatment well    Behavior During Therapy [facility] for tasks assessed/performed             Past Medical History:  Diagnosis Date   Anxiety    Arthritis    Bunion    DDD (degenerative disc disease), cervical    Degenerative scoliosis in adult patient 08/22/2019   Depression    Endometrial polyp    Fibrocystic breast changes    Fundic gland polyps of stomach, benign 08/2016   GERD (gastroesophageal reflux disease)    Headache, migraine    Hirsutism    Hyperlipidemia    IBS (irritable bowel syndrome)    Internal and external prolapsed hemorrhoids 09/04/2018   Iron deficiency anemia    Osteoarthritis    Osteoporosis    PONV (postoperative nausea and vomiting)    Tinnitus    Past Surgical History:  Procedure Laterality Date   CARPAL TUNNEL RELEASE Left 01/2021   CERVICAL FUSION  05/2020   COLONOSCOPY     DILATION AND CURETTAGE OF UTERUS  1995   x 2   EUS     HYSTEROSCOPY     LUMBAR FUSION  07/2019   and disc replacement   TUBAL LIGATION  1989   UPPER GASTROINTESTINAL ENDOSCOPY     Patient Active Problem List   Diagnosis Date Noted   Degenerative scoliosis in adult patient 08/22/2019   Internal and external prolapsed hemorrhoids 09/04/2018    PCP: Harlan Stains, MD  REFERRING PROVIDER: Harlan Stains, MD  REFERRING DIAG: M53.3 - SI (sacroiliac) joint dysfunction  Rationale for Evaluation and Treatment: Rehabilitation  THERAPY DIAG:  SI (sacroiliac) joint dysfunction - Plan: PT plan of care  cert/re-cert  Muscle weakness (generalized) - Plan: PT plan of care cert/re-cert  Other abnormalities of gait and mobility - Plan: PT plan of care cert/re-cert  ONSET DATE: At least 3 months ago  SUBJECTIVE:  SUBJECTIVE STATEMENT: Pt reports that she has been having some right sided SI joint pain for at least 3 months.  Reports increased pain with yard work or with longer drives.    PERTINENT HISTORY:  GERD, OA, osteoporosis, migraines  PAIN:  Are you having pain? Yes: NPRS scale: 1-9/10 Pain location: right SI joint Pain description: aching Aggravating factors: yard work, prolonged driving Relieving factors: lying down, medication  PRECAUTIONS: None  WEIGHT BEARING RESTRICTIONS: No  FALLS:  Has patient fallen in last 6 months? No  LIVING ENVIRONMENT: Lives with: lives with their spouse Lives in: House/apartment Stairs: Yes: External: 2 steps; on right going up Has following equipment at home: Single point cane, shower chair, and Grab bars  OCCUPATION: Retired  PLOF: Independent and Leisure: sewing, teaching ESL to adult immigrants, active in church, puzzles  PATIENT GOALS: To be able to live life with less pain.  NEXT MD VISIT: as needed  OBJECTIVE:   DIAGNOSTIC FINDINGS:  N/A.  Bone density scheduled 01/27/2023  PATIENT SURVEYS:  Eval:  FOTO 52% (projected 63% by visit 12)  SCREENING FOR RED FLAGS: Bowel or bladder incontinence: No Spinal tumors: No Cauda equina syndrome: No Compression fracture: No Abdominal aneurysm: No  COGNITION: Overall cognitive status: Within functional limits for tasks assessed     SENSATION: WFL  MUSCLE LENGTH: Hamstrings: left 70 degrees, right 70 degrees  POSTURE: rounded shoulders and forward head  PALPATION: Tenderness to palpation  over right SI joint area  LOWER EXTREMITY MMT:   Eval: Right hip strength of 4+/5 Right hamstring strength of 5-/5 Left LE strength is 5/5 throughout  LUMBAR SPECIAL TESTS:  Eval: Slump test: Positive on right side, negative on left SI joint compression:  negative SI joint distraction:  positive for pain  FUNCTIONAL TESTS:  01/17/2023: 5 times sit to stand: 9.9 sec Single leg stance:  right- 30 sec (but noted with increased unsteadiness and requires increased movement of LE), left- 30 sec  GAIT: Distance walked: >200 ft Assistive device utilized: None Level of assistance: Complete Independence Comments: At times has antalgic gait as distance progresses  TODAY'S TREATMENT:                                                                                                                              DATE: 01/17/2023  Reviewed HEP (see below) Reviewed benefits of aquatics and provided handout for aquatics procedures   PATIENT EDUCATION:  Education details: Issued HEP Person educated: Patient Education method: Explanation, Demonstration, and Handouts Education comprehension: verbalized understanding and returned demonstration  HOME EXERCISE PROGRAM: Access Code: [email or web address] URL: [email or web address] Date: 01/17/2023 Prepared by: Shelby Dubin Janus Vlcek  Exercises - Clamshell  - 1 x daily - 7 x weekly - 2 sets - 10 reps - Sidelying Reverse Clamshell  - 1 x daily - 7 x weekly - 2 sets - 10 reps - Side Bicycle  - 1 x daily - 7 x weekly - 2 sets -  10 reps - Seated Hamstring Stretch  - 1 x daily - 7 x weekly - 1 sets - 2 reps - 20 sec hold  ASSESSMENT:  CLINICAL IMPRESSION: Patient is a 72 y.o. female who was seen today for physical therapy evaluation and treatment for SI joint dysfunction. Patient is known to this PT clinic from previously for cervical pain.  Patient states that she has been going to exercise at [employer] at the gym.  States that she has been getting  gradually worse right sided SI joint pain over the past 3 months which led her to see Dr Dema Severin, then received a referral to PT.  Patient reports that she is having increased pain when she is driving for at least an hour.  Patient presents with hamstring tightness, increased right sided SI joint pain, and decreased stability with single leg stance on right leg.  Patient would benefit from skilled PT to progress towards goal related activities.  OBJECTIVE IMPAIRMENTS: decreased balance, difficulty walking, decreased strength, increased muscle spasms, impaired flexibility, and pain.   ACTIVITY LIMITATIONS: bending, standing, squatting, and stairs  PARTICIPATION LIMITATIONS: driving, community activity, and yard work  PERSONAL FACTORS: Time since onset of injury/illness/exacerbation and 3+ comorbidities: OA, osteoporosis, migraines  are also affecting patient's functional outcome.   REHAB POTENTIAL: Good  CLINICAL DECISION MAKING: Evolving/moderate complexity  EVALUATION COMPLEXITY: Moderate   GOALS: Goals reviewed with patient? Yes  SHORT TERM GOALS: Target date: 02/04/2023  Pt will be independent with initial HEP. Baseline: Goal status: INITIAL  2.  Patient to report at least a 30% improvement with pain since initial evaluation. Baseline:  Goal status: INITIAL   LONG TERM GOALS: Target date: 03/11/2023  Pt to be independent with advanced HEP and knowledgeable in self progression of HEP. Baseline:  Goal status: INITIAL  2.  Patient to increase FOTO to at least 63% to demonstrate increased ability to perform functional mobility Baseline: 52% Goal status: INITIAL  3.  Patient to increase right hip strength to at least 5-/5 to allow patient to perform yard work with less difficulty. Baseline:  Goal status: INITIAL  4.  Patient to report ability to drive for greater than 45 min without increased pain. Baseline:  Goal status: INITIAL   PLAN:  PT FREQUENCY: 2x/week  PT  DURATION: 8 weeks  PLANNED INTERVENTIONS: Therapeutic exercises, Therapeutic activity, Neuromuscular re-education, Balance training, Gait training, Patient/Family education, Self Care, Joint mobilization, Joint manipulation, Stair training, Aquatic Therapy, Dry Needling, Electrical stimulation, Spinal manipulation, Spinal mobilization, Cryotherapy, Moist heat, Taping, Ultrasound, Ionotophoresis '4mg'$ /ml Dexamethasone, Manual therapy, and Re-evaluation.  PLAN FOR NEXT SESSION: assess and progress HEP as indicated, strengthening, SI mobilization   [facility], PT 01/17/2023, 12:13 PM   [facility] [address] [place], [place] [address] Phone # [phone] Fax [phone]

## 2023-01-17 NOTE — Patient Instructions (Signed)
     [facility] Welcome to [facility]! Here you will find all the information you will need regarding your pool therapy. If you have further questions at any time, please call our office at [phone]. After completing your initial evaluation in the [facility], you may be eligible to complete a portion of your therapy in the pool. A typical week of therapy will consist of 1-2 typical physical therapy visits at our [facility] location and an additional session of therapy in the pool located at the [facility] at [address]. [address], [place] [address]. The phone number at the pool site is [phone]. Please call this number if you are running late or need to cancel your appointment.  Aquatic therapy will be offered on Wednesday mornings and Friday afternoons. Each session will last approximately 45 minutes. All scheduling and payments for aquatic therapy sessions, including cancelations, will be done through our [facility] location.  To be eligible for aquatic therapy, these criteria must be met: You must be able to independently change in the locker room and get to the pool deck. A caregiver can come with you to help if needed. There are benches for a caregiver to sit on next to the pool. No one with an open wound is permitted in the pool.  Handicap parking is available in the front and there is a drop off option for even closer accessibility. Please arrive 15 minutes prior to your appointment to prepare for your pool session. You must sign in at the front desk upon your arrival. Please be sure to attend to any toileting needs prior to entering the pool. Locker rooms for changing are available.  There is direct access to the pool deck from the locker room. You can lock your belongings in a locker or bring them with you poolside. Your therapist will greet you on the pool deck. There may be other swimmers in the pool at the  same time but your session is one-on-one with the therapist.   

## 2023-01-21 ENCOUNTER — Encounter: Payer: Self-pay | Admitting: Physical Therapy

## 2023-01-21 ENCOUNTER — Ambulatory Visit: Payer: Medicare PPO | Admitting: Physical Therapy

## 2023-01-21 DIAGNOSIS — M6281 Muscle weakness (generalized): Secondary | ICD-10-CM

## 2023-01-21 DIAGNOSIS — M533 Sacrococcygeal disorders, not elsewhere classified: Secondary | ICD-10-CM | POA: Diagnosis not present

## 2023-01-21 DIAGNOSIS — R2689 Other abnormalities of gait and mobility: Secondary | ICD-10-CM

## 2023-01-21 NOTE — Therapy (Deleted)
OUTPATIENT PHYSICAL THERAPY TREATMENT NOTE   Patient Name: Audrey Powers MRN: [id number] DOB:1951-01-29, 72 y.o., female Today's Date: 01/21/2023  PCP: Marland Kitchen REFERRING PROVIDER: ***  END OF SESSION:   PT End of Session - 01/21/23 1955     Visit Number 2    Date for PT Re-Evaluation 03/11/23    Authorization Type Medicare Cohere    Authorization Time Period auth requested    PT Start Time 1425    PT Stop Time 1505    PT Time Calculation (min) 40 min    Activity Tolerance Patient tolerated treatment well    Behavior During Therapy [facility] for tasks assessed/performed             Past Medical History:  Diagnosis Date   Anxiety    Arthritis    Bunion    DDD (degenerative disc disease), cervical    Degenerative scoliosis in adult patient 08/22/2019   Depression    Endometrial polyp    Fibrocystic breast changes    Fundic gland polyps of stomach, benign 08/2016   GERD (gastroesophageal reflux disease)    Headache, migraine    Hirsutism    Hyperlipidemia    IBS (irritable bowel syndrome)    Internal and external prolapsed hemorrhoids 09/04/2018   Iron deficiency anemia    Osteoarthritis    Osteoporosis    PONV (postoperative nausea and vomiting)    Tinnitus    Past Surgical History:  Procedure Laterality Date   CARPAL TUNNEL RELEASE Left 01/2021   CERVICAL FUSION  05/2020   COLONOSCOPY     DILATION AND CURETTAGE OF UTERUS  1995   x 2   EUS     HYSTEROSCOPY     LUMBAR FUSION  07/2019   and disc replacement   TUBAL LIGATION  1989   UPPER GASTROINTESTINAL ENDOSCOPY     Patient Active Problem List   Diagnosis Date Noted   Degenerative scoliosis in adult patient 08/22/2019   Internal and external prolapsed hemorrhoids 09/04/2018    REFERRING DIAG: ***  THERAPY DIAG:  SI (sacroiliac) joint dysfunction  Muscle weakness (generalized)  Other abnormalities of gait and mobility  Rationale for Evaluation and Treatment {[id number]}  PERTINENT  HISTORY: ***  PRECAUTIONS: ***  SUBJECTIVE:                                                                                                                                                                                      SUBJECTIVE STATEMENT:  ***   PAIN:  Are you having pain? {OPRCPAIN:27236}   OBJECTIVE: (objective measures completed at initial evaluation unless otherwise dated)   (Copy Eval's Objective through Plan section  here)   Paiton [place], PTA 01/21/2023, 8:26 PM

## 2023-01-21 NOTE — Therapy (Signed)
OUTPATIENT PHYSICAL THERAPY TREATMENT NOTE   Patient Name: Audrey Powers MRN: [id number] DOB:March 21, 1951, 72 y.o., female Today's Date: 01/21/2023  PCP: Harlan Stains, MD REFERRING PROVIDER: Harlan Stains, MD  END OF SESSION:   PT End of Session - 01/21/23 1955     Visit Number 2    Date for PT Re-Evaluation 03/11/23    Authorization Type Medicare Cohere    Authorization Time Period auth requested    PT Start Time 1425    PT Stop Time 1505    PT Time Calculation (min) 40 min    Activity Tolerance Patient tolerated treatment well    Behavior During Therapy [facility] for tasks assessed/performed             Past Medical History:  Diagnosis Date   Anxiety    Arthritis    Bunion    DDD (degenerative disc disease), cervical    Degenerative scoliosis in adult patient 08/22/2019   Depression    Endometrial polyp    Fibrocystic breast changes    Fundic gland polyps of stomach, benign 08/2016   GERD (gastroesophageal reflux disease)    Headache, migraine    Hirsutism    Hyperlipidemia    IBS (irritable bowel syndrome)    Internal and external prolapsed hemorrhoids 09/04/2018   Iron deficiency anemia    Osteoarthritis    Osteoporosis    PONV (postoperative nausea and vomiting)    Tinnitus    Past Surgical History:  Procedure Laterality Date   CARPAL TUNNEL RELEASE Left 01/2021   CERVICAL FUSION  05/2020   COLONOSCOPY     DILATION AND CURETTAGE OF UTERUS  1995   x 2   EUS     HYSTEROSCOPY     LUMBAR FUSION  07/2019   and disc replacement   TUBAL LIGATION  1989   UPPER GASTROINTESTINAL ENDOSCOPY     Patient Active Problem List   Diagnosis Date Noted   Degenerative scoliosis in adult patient 08/22/2019   Internal and external prolapsed hemorrhoids 09/04/2018    REFERRING DIAG: M53.3 - SI (sacroiliac) joint dysfunction   THERAPY DIAG:  SI (sacroiliac) joint dysfunction  Muscle weakness (generalized)  Other abnormalities of gait and  mobility  Rationale for Evaluation and Treatment Rehabilitation  PERTINENT HISTORY: GERD, OA, osteoporosis, migraines   PRECAUTIONS: None  SUBJECTIVE:                                                                                                                                                                                      SUBJECTIVE STATEMENT:  I stopped taking one of my medications on Wednesday and I can tell.  PAIN:  Are you having pain? Yes: NPRS scale: 4/10 Pain location: Rt SI Pain description: Sore Aggravating factors: Fairly constant Relieving factors: Meds   OBJECTIVE: (objective measures completed at initial evaluation unless otherwise dated)  DIAGNOSTIC FINDINGS:  N/A.  Bone density scheduled 01/27/2023   PATIENT SURVEYS:  Eval:  FOTO 52% (projected 63% by visit 12)   SCREENING FOR RED FLAGS: Bowel or bladder incontinence: No Spinal tumors: No Cauda equina syndrome: No Compression fracture: No Abdominal aneurysm: No   COGNITION: Overall cognitive status: Within functional limits for tasks assessed                          SENSATION: WFL   MUSCLE LENGTH: Hamstrings: left 70 degrees, right 70 degrees   POSTURE: rounded shoulders and forward head   PALPATION: Tenderness to palpation over right SI joint area   LOWER EXTREMITY MMT:   Eval: Right hip strength of 4+/5 Right hamstring strength of 5-/5 Left LE strength is 5/5 throughout   LUMBAR SPECIAL TESTS:  Eval: Slump test: Positive on right side, negative on left SI joint compression:  negative SI joint distraction:  positive for pain   FUNCTIONAL TESTS:  01/17/2023: 5 times sit to stand: 9.9 sec Single leg stance:  right- 30 sec (but noted with increased unsteadiness and requires increased movement of LE), left- 30 sec   GAIT: Distance walked: >200 ft Assistive device utilized: None Level of assistance: Complete Independence Comments: At times has antalgic gait as distance  progresses   TODAY'S TREATMENT:   01/21/23: Pt arrives for aquatic physical therapy. Treatment took place in 3.5-5.5 feet of water. Water temperature was 91 degrees F. Pt entered the pool via stairs, with caution and slight use of hand rails. Pt requires buoyancy of water for support and to offload joints with strengthening exercises.  Seated water bench with 75% submersion Pt performed seated LE AROM exercises 20x in all planes, 75% depth water walking 6x each direction with small noodle for support. PPT against wall with RT single knee to chest stretch 3x 20 sec. PPT with small noodle lat/core press 10x. High knee marching across pool with small noodle 4x. Normal stance with mitten hands: Vc to contract gluteals while arms press forward and back 10x. Seated decompression with large noodle behind patient.                                                                                                                           DATE: 01/17/2023  Reviewed HEP (see below) Reviewed benefits of aquatics and provided handout for aquatics procedures     PATIENT EDUCATION:  Education details: Issued HEP Person educated: Patient Education method: Explanation, Demonstration, and Handouts Education comprehension: verbalized understanding and returned demonstration   HOME EXERCISE PROGRAM: Access Code: [email or web address] URL: [email or web address] Date: 01/17/2023 Prepared by: Shelby Dubin Menke   Exercises - Clamshell  - 1 x daily - 7 x weekly - 2 sets -  10 reps - Sidelying Reverse Clamshell  - 1 x daily - 7 x weekly - 2 sets - 10 reps - Side Bicycle  - 1 x daily - 7 x weekly - 2 sets - 10 reps - Seated Hamstring Stretch  - 1 x daily - 7 x weekly - 1 sets - 2 reps - 20 sec hold   ASSESSMENT:   CLINICAL IMPRESSION: Pt arrives to her first aquatic PT session with slight increase in RT SI pain which she contributes to stopping a medication on Wednesday. This pain was abolished 50% at end of todays  session. Decompression/traction was helpful to reducing her pain. Exercises did not increase her pain.   OBJECTIVE IMPAIRMENTS: decreased balance, difficulty walking, decreased strength, increased muscle spasms, impaired flexibility, and pain.    ACTIVITY LIMITATIONS: bending, standing, squatting, and stairs   PARTICIPATION LIMITATIONS: driving, community activity, and yard work   PERSONAL FACTORS: Time since onset of injury/illness/exacerbation and 3+ comorbidities: OA, osteoporosis, migraines  are also affecting patient's functional outcome.    REHAB POTENTIAL: Good   CLINICAL DECISION MAKING: Evolving/moderate complexity   EVALUATION COMPLEXITY: Moderate     GOALS: Goals reviewed with patient? Yes   SHORT TERM GOALS: Target date: 02/04/2023   Pt will be independent with initial HEP. Baseline: Goal status: INITIAL   2.  Patient to report at least a 30% improvement with pain since initial evaluation. Baseline:  Goal status: INITIAL     LONG TERM GOALS: Target date: 03/11/2023   Pt to be independent with advanced HEP and knowledgeable in self progression of HEP. Baseline:  Goal status: INITIAL   2.  Patient to increase FOTO to at least 63% to demonstrate increased ability to perform functional mobility Baseline: 52% Goal status: INITIAL   3.  Patient to increase right hip strength to at least 5-/5 to allow patient to perform yard work with less difficulty. Baseline:  Goal status: INITIAL   4.  Patient to report ability to drive for greater than 45 min without increased pain. Baseline:  Goal status: INITIAL     PLAN:   PT FREQUENCY: 2x/week   PT DURATION: 8 weeks   PLANNED INTERVENTIONS: Therapeutic exercises, Therapeutic activity, Neuromuscular re-education, Balance training, Gait training, Patient/Family education, Self Care, Joint mobilization, Joint manipulation, Stair training, Aquatic Therapy, Dry Needling, Electrical stimulation, Spinal manipulation, Spinal  mobilization, Cryotherapy, Moist heat, Taping, Ultrasound, Ionotophoresis '4mg'$ /ml Dexamethasone, Manual therapy, and Re-evaluation.   PLAN FOR NEXT SESSION: assess and progress HEP as indicated, strengthening, SI mobilization     Myrene Galas, PTA 01/21/23 8:34 PM   [place], PTA 01/21/2023, 8:29 PM

## 2023-01-21 NOTE — Therapy (Deleted)
OUTPATIENT PHYSICAL THERAPY  EVALUATION   Patient Name: Audrey Powers MRN: [id number] DOB:1951/05/19, 72 y.o., female Today's Date: 01/21/2023  END OF SESSION:  PT End of Session - 01/21/23 1955     Visit Number 2    Date for PT Re-Evaluation 03/11/23    Authorization Type Medicare Cohere    Authorization Time Period auth requested    PT Start Time 1425    PT Stop Time 1505    PT Time Calculation (min) 40 min    Activity Tolerance Patient tolerated treatment well    Behavior During Therapy [facility] for tasks assessed/performed             Past Medical History:  Diagnosis Date   Anxiety    Arthritis    Bunion    DDD (degenerative disc disease), cervical    Degenerative scoliosis in adult patient 08/22/2019   Depression    Endometrial polyp    Fibrocystic breast changes    Fundic gland polyps of stomach, benign 08/2016   GERD (gastroesophageal reflux disease)    Headache, migraine    Hirsutism    Hyperlipidemia    IBS (irritable bowel syndrome)    Internal and external prolapsed hemorrhoids 09/04/2018   Iron deficiency anemia    Osteoarthritis    Osteoporosis    PONV (postoperative nausea and vomiting)    Tinnitus    Past Surgical History:  Procedure Laterality Date   CARPAL TUNNEL RELEASE Left 01/2021   CERVICAL FUSION  05/2020   COLONOSCOPY     DILATION AND CURETTAGE OF UTERUS  1995   x 2   EUS     HYSTEROSCOPY     LUMBAR FUSION  07/2019   and disc replacement   TUBAL LIGATION  1989   UPPER GASTROINTESTINAL ENDOSCOPY     Patient Active Problem List   Diagnosis Date Noted   Degenerative scoliosis in adult patient 08/22/2019   Internal and external prolapsed hemorrhoids 09/04/2018    PCP: Harlan Stains, MD  REFERRING PROVIDER: Harlan Stains, MD  REFERRING DIAG: M53.3 - SI (sacroiliac) joint dysfunction  Rationale for Evaluation and Treatment: Rehabilitation  THERAPY DIAG:  SI (sacroiliac) joint dysfunction  Muscle weakness  (generalized)  Other abnormalities of gait and mobility  ONSET DATE: At least 3 months ago  SUBJECTIVE:                                                                                                                                                                                           SUBJECTIVE STATEMENT: Pt reports that she has been having some right sided SI joint pain for at least  3 months.  Reports increased pain with yard work or with longer drives.    PERTINENT HISTORY:  GERD, OA, osteoporosis, migraines  PAIN:  Are you having pain? Yes: NPRS scale: 1-9/10 Pain location: right SI joint Pain description: aching Aggravating factors: yard work, prolonged driving Relieving factors: lying down, medication  PRECAUTIONS: None  WEIGHT BEARING RESTRICTIONS: No  FALLS:  Has patient fallen in last 6 months? No  LIVING ENVIRONMENT: Lives with: lives with their spouse Lives in: House/apartment Stairs: Yes: External: 2 steps; on right going up Has following equipment at home: Single point cane, shower chair, and Grab bars  OCCUPATION: Retired  PLOF: Independent and Leisure: sewing, teaching ESL to adult immigrants, active in church, puzzles  PATIENT GOALS: To be able to live life with less pain.  NEXT MD VISIT: as needed  OBJECTIVE:   DIAGNOSTIC FINDINGS:  N/A.  Bone density scheduled 01/27/2023  PATIENT SURVEYS:  Eval:  FOTO 52% (projected 63% by visit 12)  SCREENING FOR RED FLAGS: Bowel or bladder incontinence: No Spinal tumors: No Cauda equina syndrome: No Compression fracture: No Abdominal aneurysm: No  COGNITION: Overall cognitive status: Within functional limits for tasks assessed     SENSATION: WFL  MUSCLE LENGTH: Hamstrings: left 70 degrees, right 70 degrees  POSTURE: rounded shoulders and forward head  PALPATION: Tenderness to palpation over right SI joint area  LOWER EXTREMITY MMT:   Eval: Right hip strength of 4+/5 Right hamstring  strength of 5-/5 Left LE strength is 5/5 throughout  LUMBAR SPECIAL TESTS:  Eval: Slump test: Positive on right side, negative on left SI joint compression:  negative SI joint distraction:  positive for pain  FUNCTIONAL TESTS:  01/17/2023: 5 times sit to stand: 9.9 sec Single leg stance:  right- 30 sec (but noted with increased unsteadiness and requires increased movement of LE), left- 30 sec  GAIT: Distance walked: >200 ft Assistive device utilized: None Level of assistance: Complete Independence Comments: At times has antalgic gait as distance progresses  TODAY'S TREATMENT:                                                                                                                              DATE: 01/17/2023  Reviewed HEP (see below) Reviewed benefits of aquatics and provided handout for aquatics procedures   PATIENT EDUCATION:  Education details: Issued HEP Person educated: Patient Education method: Explanation, Demonstration, and Handouts Education comprehension: verbalized understanding and returned demonstration  HOME EXERCISE PROGRAM: Access Code: [email or web address] URL: [email or web address] Date: 01/17/2023 Prepared by: Shelby Dubin Menke  Exercises - Clamshell  - 1 x daily - 7 x weekly - 2 sets - 10 reps - Sidelying Reverse Clamshell  - 1 x daily - 7 x weekly - 2 sets - 10 reps - Side Bicycle  - 1 x daily - 7 x weekly - 2 sets - 10 reps - Seated Hamstring Stretch  - 1 x daily - 7 x weekly - 1 sets -  2 reps - 20 sec hold  ASSESSMENT:  CLINICAL IMPRESSION: Patient is a 72 y.o. female who was seen today for physical therapy evaluation and treatment for SI joint dysfunction. Patient is known to this PT clinic from previously for cervical pain.  Patient states that she has been going to exercise at [employer] at the gym.  States that she has been getting gradually worse right sided SI joint pain over the past 3 months which led her to see Dr Dema Severin, then  received a referral to PT.  Patient reports that she is having increased pain when she is driving for at least an hour.  Patient presents with hamstring tightness, increased right sided SI joint pain, and decreased stability with single leg stance on right leg.  Patient would benefit from skilled PT to progress towards goal related activities.  OBJECTIVE IMPAIRMENTS: decreased balance, difficulty walking, decreased strength, increased muscle spasms, impaired flexibility, and pain.   ACTIVITY LIMITATIONS: bending, standing, squatting, and stairs  PARTICIPATION LIMITATIONS: driving, community activity, and yard work  PERSONAL FACTORS: Time since onset of injury/illness/exacerbation and 3+ comorbidities: OA, osteoporosis, migraines  are also affecting patient's functional outcome.   REHAB POTENTIAL: Good  CLINICAL DECISION MAKING: Evolving/moderate complexity  EVALUATION COMPLEXITY: Moderate   GOALS: Goals reviewed with patient? Yes  SHORT TERM GOALS: Target date: 02/04/2023  Pt will be independent with initial HEP. Baseline: Goal status: INITIAL  2.  Patient to report at least a 30% improvement with pain since initial evaluation. Baseline:  Goal status: INITIAL   LONG TERM GOALS: Target date: 03/11/2023  Pt to be independent with advanced HEP and knowledgeable in self progression of HEP. Baseline:  Goal status: INITIAL  2.  Patient to increase FOTO to at least 63% to demonstrate increased ability to perform functional mobility Baseline: 52% Goal status: INITIAL  3.  Patient to increase right hip strength to at least 5-/5 to allow patient to perform yard work with less difficulty. Baseline:  Goal status: INITIAL  4.  Patient to report ability to drive for greater than 45 min without increased pain. Baseline:  Goal status: INITIAL   PLAN:  PT FREQUENCY: 2x/week  PT DURATION: 8 weeks  PLANNED INTERVENTIONS: Therapeutic exercises, Therapeutic activity, Neuromuscular  re-education, Balance training, Gait training, Patient/Family education, Self Care, Joint mobilization, Joint manipulation, Stair training, Aquatic Therapy, Dry Needling, Electrical stimulation, Spinal manipulation, Spinal mobilization, Cryotherapy, Moist heat, Taping, Ultrasound, Ionotophoresis '4mg'$ /ml Dexamethasone, Manual therapy, and Re-evaluation.  PLAN FOR NEXT SESSION: assess and progress HEP as indicated, strengthening, SI mobilization   [place], PT 01/21/2023, 7:56 PM   [facility] [address] [place], [place] [address] Phone # [phone] Fax [phone]

## 2023-01-25 ENCOUNTER — Encounter: Payer: Self-pay | Admitting: Rehabilitative and Restorative Service Providers"

## 2023-01-25 ENCOUNTER — Ambulatory Visit: Payer: Medicare PPO | Admitting: Rehabilitative and Restorative Service Providers"

## 2023-01-25 DIAGNOSIS — M533 Sacrococcygeal disorders, not elsewhere classified: Secondary | ICD-10-CM

## 2023-01-25 DIAGNOSIS — M6281 Muscle weakness (generalized): Secondary | ICD-10-CM | POA: Diagnosis not present

## 2023-01-25 DIAGNOSIS — R2689 Other abnormalities of gait and mobility: Secondary | ICD-10-CM

## 2023-01-25 NOTE — Patient Instructions (Signed)

## 2023-01-25 NOTE — Therapy (Signed)
OUTPATIENT PHYSICAL THERAPY  TREATMENT NOTE   Patient Name: Audrey Powers MRN: [id number] DOB:[birth year], 72 y.o., female Today's Date: 01/25/2023  END OF SESSION:  PT End of Session - 01/25/23 1103     Visit Number 3    Date for PT Re-Evaluation 03/11/23    Authorization Type Medicare [employer]    Authorization Time Period 01/17/2023 - 03/11/2023    Authorization - Visit Number 3    Authorization - Number of Visits 16    Progress Note Due on Visit 10    PT Start Time 1059    PT Stop Time 1140    PT Time Calculation (min) 41 min    Activity Tolerance Patient tolerated treatment well    Behavior During Therapy [facility] for tasks assessed/performed             Past Medical History:  Diagnosis Date   Anxiety    Arthritis    Bunion    DDD (degenerative disc disease), cervical    Degenerative scoliosis in adult patient 08/22/2019   Depression    Endometrial polyp    Fibrocystic breast changes    Fundic gland polyps of stomach, benign 08/2016   GERD (gastroesophageal reflux disease)    Headache, migraine    Hirsutism    Hyperlipidemia    IBS (irritable bowel syndrome)    Internal and external prolapsed hemorrhoids 09/04/2018   Iron deficiency anemia    Osteoarthritis    Osteoporosis    PONV (postoperative nausea and vomiting)    Tinnitus    Past Surgical History:  Procedure Laterality Date   CARPAL TUNNEL RELEASE Left 01/2021   CERVICAL FUSION  05/2020   COLONOSCOPY     DILATION AND CURETTAGE OF UTERUS  1995   x 2   EUS     HYSTEROSCOPY     LUMBAR FUSION  07/2019   and disc replacement   TUBAL LIGATION  1989   UPPER GASTROINTESTINAL ENDOSCOPY     Patient Active Problem List   Diagnosis Date Noted   Degenerative scoliosis in adult patient 08/22/2019   Internal and external prolapsed hemorrhoids 09/04/2018    PCP: Audrey Stains, MD  REFERRING PROVIDER: Harlan Stains, MD  REFERRING DIAG: M53.3 - SI (sacroiliac) joint dysfunction  Rationale for  Evaluation and Treatment: Rehabilitation  THERAPY DIAG:  SI (sacroiliac) joint dysfunction  Muscle weakness (generalized)  Other abnormalities of gait and mobility  ONSET DATE: At least 3 months ago  SUBJECTIVE:  SUBJECTIVE STATEMENT: Pt reports that she has pain when she in standing and bends forward to reach for something.  Patient reports that she really enjoyed the pool exercises.  PERTINENT HISTORY:  GERD, OA, osteoporosis, migraines  PAIN:  Are you having pain? Yes: NPRS scale: 2/10 Pain location: right SI joint Pain description: aching Aggravating factors: yard work, prolonged driving Relieving factors: lying down, medication  PRECAUTIONS: None  WEIGHT BEARING RESTRICTIONS: No  FALLS:  Has patient fallen in last 6 months? No  LIVING ENVIRONMENT: Lives with: lives with their spouse Lives in: House/apartment Stairs: Yes: External: 2 steps; on right going up Has following equipment at home: Single point cane, shower chair, and Grab bars  OCCUPATION: Retired  PLOF: Independent and Leisure: sewing, teaching ESL to adult immigrants, active in church, puzzles  PATIENT GOALS: To be able to live life with less pain.  NEXT MD VISIT: as needed  OBJECTIVE:   DIAGNOSTIC FINDINGS:  N/A.  Bone density scheduled 01/27/2023  PATIENT SURVEYS:  Eval:  FOTO 52% (projected 63% by visit 12)  SCREENING FOR RED FLAGS: Bowel or bladder incontinence: No Spinal tumors: No Cauda equina syndrome: No Compression fracture: No Abdominal aneurysm: No  COGNITION: Overall cognitive status: Within functional limits for tasks assessed     SENSATION: WFL  MUSCLE LENGTH: Hamstrings: left 70 degrees, right 70 degrees  POSTURE: rounded shoulders and forward head  PALPATION: Tenderness to palpation  over right SI joint area  LOWER EXTREMITY MMT:   Eval: Right hip strength of 4+/5 Right hamstring strength of 5-/5 Left LE strength is 5/5 throughout  LUMBAR SPECIAL TESTS:  Eval: Slump test: Positive on right side, negative on left SI joint compression:  negative SI joint distraction:  positive for pain  FUNCTIONAL TESTS:  01/17/2023: 5 times sit to stand: 9.9 sec Single leg stance:  right- 30 sec (but noted with increased unsteadiness and requires increased movement of LE), left- 30 sec  GAIT: Distance walked: >200 ft Assistive device utilized: None Level of assistance: Complete Independence Comments: At times has antalgic gait as distance progresses  TODAY'S TREATMENT:                                                                                                                               DATE: 01/25/2023  Nustep level 4 x6 min with PT present to discuss status Seated with 2# around bilat ankles:  heel/toe raises, marching, LAQ, hip ER.  2x10 bilat each Seated piriformis stretch 2x20 sec bilat Seated hamstring stretch 2x20 sec bilat Sit to/from Stand holding 5# dumbbell x10 Seated clamshells with red tband 2x10 Trigger Point Dry-Needling  Treatment instructions: Expect mild to moderate muscle soreness. S/S of pneumothorax if dry needled over a lung field, and to seek immediate medical attention should they occur. Patient verbalized understanding of these instructions and education. Patient Consent Given: Yes Education handout provided: Yes Muscles treated: bilateral lumbar multifidi, bilateral glutes/piriformis Electrical stimulation performed: No  Parameters: N/A Treatment response/outcome: Utilized skilled palpation to locate/identify trigger points.  Able to illicit twitch response and muscle elongation. Manual Therapy:  soft tissue mobilization to lumbar paraspinals in prone position to further promote muscle elongation.   01/21/23:  Pt arrives for aquatic  physical therapy. Treatment took place in 3.5-5.5 feet of water. Water temperature was 91 degrees F. Pt entered the pool via stairs, with caution and slight use of hand rails. Pt requires buoyancy of water for support and to offload joints with strengthening exercises.  Seated water bench with 75% submersion Pt performed seated LE AROM exercises 20x in all planes, 75% depth water walking 6x each direction with small noodle for support. PPT against wall with RT single knee to chest stretch 3x 20 sec. PPT with small noodle lat/core press 10x. High knee marching across pool with small noodle 4x. Normal stance with mitten hands: Vc to contract gluteals while arms press forward and back 10x. Seated decompression with large noodle behind patient.    DATE: 01/17/2023  Reviewed HEP (see below) Reviewed benefits of aquatics and provided handout for aquatics procedures   PATIENT EDUCATION:  Education details: Issued HEP Person educated: Patient Education method: Explanation, Demonstration, and Handouts Education comprehension: verbalized understanding and returned demonstration  HOME EXERCISE PROGRAM: Access Code: [email or web address] URL: [email or web address] Date: 01/17/2023 Prepared by: Shelby Dubin Jobe Mutch  Exercises - Clamshell  - 1 x daily - 7 x weekly - 2 sets - 10 reps - Sidelying Reverse Clamshell  - 1 x daily - 7 x weekly - 2 sets - 10 reps - Side Bicycle  - 1 x daily - 7 x weekly - 2 sets - 10 reps - Seated Hamstring Stretch  - 1 x daily - 7 x weekly - 1 sets - 2 reps - 20 sec hold  ASSESSMENT:  CLINICAL IMPRESSION: Audrey Powers presents to skilled PT reporting that she is having some relief with the pool.  Patient agreeable to trying dry needling today, as she had success with it in the past.  Patient with good twitch response noted to dry needling.  Following dry needling and manual therapy, pt reported feeling better and able to bend to reach forward without the increased pain.  Patient  to have aquatic PT session scheduled for her next visit and will assess response to dry needling.   OBJECTIVE IMPAIRMENTS: decreased balance, difficulty walking, decreased strength, increased muscle spasms, impaired flexibility, and pain.   ACTIVITY LIMITATIONS: bending, standing, squatting, and stairs  PARTICIPATION LIMITATIONS: driving, community activity, and yard work  PERSONAL FACTORS: Time since onset of injury/illness/exacerbation and 3+ comorbidities: OA, osteoporosis, migraines  are also affecting patient's functional outcome.   REHAB POTENTIAL: Good  CLINICAL DECISION MAKING: Evolving/moderate complexity  EVALUATION COMPLEXITY: Moderate   GOALS: Goals reviewed with patient? Yes  SHORT TERM GOALS: Target date: 02/04/2023  Pt will be independent with initial HEP. Baseline: Goal status: IN PROGRESS  2.  Patient to report at least a 30% improvement with pain since initial evaluation. Baseline:  Goal status: INITIAL   LONG TERM GOALS: Target date: 03/11/2023  Pt to be independent with advanced HEP and knowledgeable in self progression of HEP. Baseline:  Goal status: INITIAL  2.  Patient to increase FOTO to at least 63% to demonstrate increased ability to perform functional mobility Baseline: 52% Goal status: INITIAL  3.  Patient to increase right hip strength to at least 5-/5 to allow patient to perform yard work with less difficulty. Baseline:  Goal status: INITIAL  4.  Patient to report ability to drive for greater than 45 min without increased pain. Baseline:  Goal status: INITIAL   PLAN:  PT FREQUENCY: 2x/week  PT DURATION: 8 weeks  PLANNED INTERVENTIONS: Therapeutic exercises, Therapeutic activity, Neuromuscular re-education, Balance training, Gait training, Patient/Family education, Self Care, Joint mobilization, Joint manipulation, Stair training, Aquatic Therapy, Dry Needling, Electrical stimulation, Spinal manipulation, Spinal mobilization,  Cryotherapy, Moist heat, Taping, Ultrasound, Ionotophoresis '4mg'$ /ml Dexamethasone, Manual therapy, and Re-evaluation.  PLAN FOR NEXT SESSION: assess and progress HEP as indicated, strengthening, SI mobilization, assess response to dry needling   Analie Katzman, PT 01/25/2023, 12:00 PM   [facility] [address] [place], [place] [address] Phone # [phone] Fax [phone]

## 2023-01-27 ENCOUNTER — Ambulatory Visit
Admission: RE | Admit: 2023-01-27 | Discharge: 2023-01-27 | Disposition: A | Discharge status: Home / Self Care | Payer: Medicare PPO | Source: Ambulatory Visit | Attending: Home Modifications | Admitting: Home Modifications

## 2023-01-27 DIAGNOSIS — M85832 Other specified disorders of bone density and structure, left forearm: Secondary | ICD-10-CM | POA: Diagnosis not present

## 2023-01-27 DIAGNOSIS — M810 Age-related osteoporosis without current pathological fracture: Secondary | ICD-10-CM | POA: Diagnosis not present

## 2023-01-27 DIAGNOSIS — Z780 Asymptomatic menopausal state: Secondary | ICD-10-CM | POA: Diagnosis not present

## 2023-01-28 ENCOUNTER — Ambulatory Visit: Payer: Medicare PPO | Admitting: Physical Therapy

## 2023-02-01 ENCOUNTER — Ambulatory Visit: Payer: Medicare PPO | Attending: Family Medicine | Admitting: Rehabilitative and Restorative Service Providers"

## 2023-02-01 ENCOUNTER — Encounter: Payer: Self-pay | Admitting: Rehabilitative and Restorative Service Providers"

## 2023-02-01 DIAGNOSIS — R2689 Other abnormalities of gait and mobility: Secondary | ICD-10-CM | POA: Diagnosis not present

## 2023-02-01 DIAGNOSIS — M6281 Muscle weakness (generalized): Secondary | ICD-10-CM | POA: Diagnosis not present

## 2023-02-01 DIAGNOSIS — M533 Sacrococcygeal disorders, not elsewhere classified: Secondary | ICD-10-CM | POA: Insufficient documentation

## 2023-02-01 NOTE — Therapy (Signed)
OUTPATIENT PHYSICAL THERAPY  TREATMENT NOTE   Patient Name: Audrey Powers MRN: [id number] DOB:1951/03/21, 72 y.o., female Today's Date: 02/01/2023  END OF SESSION:  PT End of Session - 02/01/23 1453     Visit Number 4    Date for PT Re-Evaluation 03/11/23    Authorization Type Medicare [employer]    Authorization Time Period 01/17/2023 - 03/11/2023    Authorization - Visit Number 4    Authorization - Number of Visits 16    Progress Note Due on Visit 10    PT Start Time 0051    PT Stop Time 1525    PT Time Calculation (min) 40 min    Activity Tolerance Patient tolerated treatment well    Behavior During Therapy [facility] for tasks assessed/performed             Past Medical History:  Diagnosis Date   Anxiety    Arthritis    Bunion    DDD (degenerative disc disease), cervical    Degenerative scoliosis in adult patient 08/22/2019   Depression    Endometrial polyp    Fibrocystic breast changes    Fundic gland polyps of stomach, benign 08/2016   GERD (gastroesophageal reflux disease)    Headache, migraine    Hirsutism    Hyperlipidemia    IBS (irritable bowel syndrome)    Internal and external prolapsed hemorrhoids 09/04/2018   Iron deficiency anemia    Osteoarthritis    Osteoporosis    PONV (postoperative nausea and vomiting)    Tinnitus    Past Surgical History:  Procedure Laterality Date   CARPAL TUNNEL RELEASE Left 01/2021   CERVICAL FUSION  05/2020   COLONOSCOPY     DILATION AND CURETTAGE OF UTERUS  1995   x 2   EUS     HYSTEROSCOPY     LUMBAR FUSION  07/2019   and disc replacement   TUBAL LIGATION  1989   UPPER GASTROINTESTINAL ENDOSCOPY     Patient Active Problem List   Diagnosis Date Noted   Degenerative scoliosis in adult patient 08/22/2019   Internal and external prolapsed hemorrhoids 09/04/2018    PCP: Harlan Stains, MD  REFERRING PROVIDER: Harlan Stains, MD  REFERRING DIAG: M53.3 - SI (sacroiliac) joint dysfunction  Rationale for  Evaluation and Treatment: Rehabilitation  THERAPY DIAG:  SI (sacroiliac) joint dysfunction  Muscle weakness (generalized)  Other abnormalities of gait and mobility  ONSET DATE: At least 3 months ago  SUBJECTIVE:  SUBJECTIVE STATEMENT: Pt reports that she missed her aquatics session secondary to being on antibiotics and it causing her GI distress.  Patient denies current pain, but states that she at times is having pain when lying down in bed.  PERTINENT HISTORY:  GERD, OA, osteoporosis, migraines  PAIN:  Are you having pain? Yes: NPRS scale: 0/10 Pain location: right SI joint Pain description: aching Aggravating factors: yard work, prolonged driving Relieving factors: lying down, medication  PRECAUTIONS: None  WEIGHT BEARING RESTRICTIONS: No  FALLS:  Has patient fallen in last 6 months? No  LIVING ENVIRONMENT: Lives with: lives with their spouse Lives in: House/apartment Stairs: Yes: External: 2 steps; on right going up Has following equipment at home: Single point cane, shower chair, and Grab bars  OCCUPATION: Retired  PLOF: Independent and Leisure: sewing, teaching ESL to adult immigrants, active in church, puzzles  PATIENT GOALS: To be able to live life with less pain.  NEXT MD VISIT: as needed  OBJECTIVE:   DIAGNOSTIC FINDINGS:  N/A.  Bone density scheduled 01/27/2023  PATIENT SURVEYS:  Eval:  FOTO 52% (projected 63% by visit 12)  SCREENING FOR RED FLAGS: Bowel or bladder incontinence: No Spinal tumors: No Cauda equina syndrome: No Compression fracture: No Abdominal aneurysm: No  COGNITION: Overall cognitive status: Within functional limits for tasks assessed     SENSATION: WFL  MUSCLE LENGTH: Hamstrings: left 70 degrees, right 70 degrees  POSTURE: rounded  shoulders and forward head  PALPATION: Tenderness to palpation over right SI joint area  LOWER EXTREMITY MMT:   Eval: Right hip strength of 4+/5 Right hamstring strength of 5-/5 Left LE strength is 5/5 throughout  LUMBAR SPECIAL TESTS:  Eval: Slump test: Positive on right side, negative on left SI joint compression:  negative SI joint distraction:  positive for pain  FUNCTIONAL TESTS:  01/17/2023: 5 times sit to stand: 9.9 sec Single leg stance:  right- 30 sec (but noted with increased unsteadiness and requires increased movement of LE), left- 30 sec  GAIT: Distance walked: >200 ft Assistive device utilized: None Level of assistance: Complete Independence Comments: At times has antalgic gait as distance progresses  TODAY'S TREATMENT:                                                                                                                               DATE: 02/01/2023  Nustep level 5 x6 min with PT present to discuss status Seated hamstring stretch 2x20 sec bilat Seated piriformis stretch 2x20 sec bilat Seated with 2# around bilat ankles:  heel/toe raises, marching, LAQ, hip ER.  2x10 bilat each Sit to/from Stand holding 5# kettlebell x10 Seated core series with 5# kettlebell:  hip to hip, hip to shoulder.  X10 bilat each Sidelying SI joint pelvic slide x10 bilat Trigger Point Dry-Needling  Treatment instructions: Expect mild to moderate muscle soreness. S/S of pneumothorax if dry needled over a lung field, and to seek immediate medical attention should  they occur. Patient verbalized understanding of these instructions and education. Patient Consent Given: Yes Education handout provided: Yes Muscles treated: bilateral lumbar multifidi Electrical stimulation performed: No Parameters: N/A Treatment response/outcome: Utilized skilled palpation to locate/identify trigger points.  Able to illicit twitch response and muscle elongation.   DATE: 01/25/2023  Nustep level 4  x6 min with PT present to discuss status Seated with 2# around bilat ankles:  heel/toe raises, marching, LAQ, hip ER.  2x10 bilat each Seated piriformis stretch 2x20 sec bilat Seated hamstring stretch 2x20 sec bilat Sit to/from Stand holding 5# dumbbell x10 Seated clamshells with red tband 2x10 Trigger Point Dry-Needling  Treatment instructions: Expect mild to moderate muscle soreness. S/S of pneumothorax if dry needled over a lung field, and to seek immediate medical attention should they occur. Patient verbalized understanding of these instructions and education. Patient Consent Given: Yes Education handout provided: Yes Muscles treated: bilateral lumbar multifidi, bilateral glutes/piriformis Electrical stimulation performed: No Parameters: N/A Treatment response/outcome: Utilized skilled palpation to locate/identify trigger points.  Able to illicit twitch response and muscle elongation. Manual Therapy:  soft tissue mobilization to lumbar paraspinals in prone position to further promote muscle elongation.   01/21/23:  Pt arrives for aquatic physical therapy. Treatment took place in 3.5-5.5 feet of water. Water temperature was 91 degrees F. Pt entered the pool via stairs, with caution and slight use of hand rails. Pt requires buoyancy of water for support and to offload joints with strengthening exercises.  Seated water bench with 75% submersion Pt performed seated LE AROM exercises 20x in all planes, 75% depth water walking 6x each direction with small noodle for support. PPT against wall with RT single knee to chest stretch 3x 20 sec. PPT with small noodle lat/core press 10x. High knee marching across pool with small noodle 4x. Normal stance with mitten hands: Vc to contract gluteals while arms press forward and back 10x. Seated decompression with large noodle behind patient.     PATIENT EDUCATION:  Education details: Issued HEP Person educated: Patient Education method: Explanation,  Demonstration, and Handouts Education comprehension: verbalized understanding and returned demonstration  HOME EXERCISE PROGRAM: Access Code: [email or web address] URL: [email or web address] Date: 02/01/2023 Prepared by: Shelby Dubin Merve Hotard  Exercises - Supine Posterior Pelvic Tilt  - 1 x daily - 7 x weekly - 2 sets - 10 reps - Clamshell  - 1 x daily - 7 x weekly - 2 sets - 10 reps - Sidelying Reverse Clamshell  - 1 x daily - 7 x weekly - 2 sets - 10 reps - Side Bicycle  - 1 x daily - 7 x weekly - 2 sets - 10 reps - Seated Hamstring Stretch  - 1 x daily - 7 x weekly - 1 sets - 2 reps - 20 sec hold - Seated Piriformis Stretch  - 1 x daily - 7 x weekly - 1 sets - 2 reps - 20 sec hold - Seated Transversus Abdominis Bracing  - 1 x daily - 7 x weekly - 2 sets - 10 reps - Sit to Stand with Arms Crossed  - 1 x daily - 7 x weekly - 2 sets - 10 reps  ASSESSMENT:  CLINICAL IMPRESSION: Audrey Powers presents to skilled PT reporting that she believes that she had some relief with dry needling after last session.  Patient reports progress with decreased pain with greater than 30% improvement since initial evaluation.  Patient able to progress with exercises during session with only minimal cuing for technique and body mechanics.  Patient with strong twitch response noted on left lumbar multifidi.  Patient continues to progress and will benefit with further core strengthening and body mechanics training.   OBJECTIVE IMPAIRMENTS: decreased balance, difficulty walking, decreased strength, increased muscle spasms, impaired flexibility, and pain.   ACTIVITY LIMITATIONS: bending, standing, squatting, and stairs  PARTICIPATION LIMITATIONS: driving, community activity, and yard work  PERSONAL FACTORS: Time since onset of injury/illness/exacerbation and 3+ comorbidities: OA, osteoporosis, migraines  are also affecting patient's functional outcome.   REHAB POTENTIAL: Good  CLINICAL DECISION MAKING:  Evolving/moderate complexity  EVALUATION COMPLEXITY: Moderate   GOALS: Goals reviewed with patient? Yes  SHORT TERM GOALS: Target date: 02/04/2023  Pt will be independent with initial HEP. Baseline: Goal status: MET  2.  Patient to report at least a 30% improvement with pain since initial evaluation. Baseline:  Goal status: MET   LONG TERM GOALS: Target date: 03/11/2023  Pt to be independent with advanced HEP and knowledgeable in self progression of HEP. Baseline:  Goal status: IN PROGRESS  2.  Patient to increase FOTO to at least 63% to demonstrate increased ability to perform functional mobility Baseline: 52% Goal status: INITIAL  3.  Patient to increase right hip strength to at least 5-/5 to allow patient to perform yard work with less difficulty. Baseline:  Goal status: INITIAL  4.  Patient to report ability to drive for greater than 45 min without increased pain. Baseline:  Goal status: INITIAL   PLAN:  PT FREQUENCY: 2x/week  PT DURATION: 8 weeks  PLANNED INTERVENTIONS: Therapeutic exercises, Therapeutic activity, Neuromuscular re-education, Balance training, Gait training, Patient/Family education, Self Care, Joint mobilization, Joint manipulation, Stair training, Aquatic Therapy, Dry Needling, Electrical stimulation, Spinal manipulation, Spinal mobilization, Cryotherapy, Moist heat, Taping, Ultrasound, Ionotophoresis '4mg'$ /ml Dexamethasone, Manual therapy, and Re-evaluation.  PLAN FOR NEXT SESSION: assess and progress HEP as indicated, strengthening, SI mobilization, aquatic PT, dry needling/manual therapy as indicated   Trayden Brandy, PT 02/01/2023, 3:42 PM   [facility] [address] [place], [place] [address] Phone # [phone] Fax [phone]

## 2023-02-03 NOTE — Therapy (Signed)
OUTPATIENT PHYSICAL THERAPY  TREATMENT NOTE   Patient Name: Audrey Powers MRN: [id number] DOB:[birth year], 72 y.o., female Today's Date: 02/04/2023  END OF SESSION:  PT End of Session - 02/04/23 1445     Visit Number 5    Date for PT Re-Evaluation 03/11/23    Authorization Type Medicare Cohere    Authorization Time Period 01/17/2023 - 03/11/2023    Authorization - Visit Number 5    Authorization - Number of Visits 16    Progress Note Due on Visit 10    PT Start Time 1330    PT Stop Time 1430    PT Time Calculation (min) 60 min    Activity Tolerance Patient tolerated treatment well    Behavior During Therapy [facility] for tasks assessed/performed             Past Medical History:  Diagnosis Date   Anxiety    Arthritis    Bunion    DDD (degenerative disc disease), cervical    Degenerative scoliosis in adult patient 08/22/2019   Depression    Endometrial polyp    Fibrocystic breast changes    Fundic gland polyps of stomach, benign 08/2016   GERD (gastroesophageal reflux disease)    Headache, migraine    Hirsutism    Hyperlipidemia    IBS (irritable bowel syndrome)    Internal and external prolapsed hemorrhoids 09/04/2018   Iron deficiency anemia    Osteoarthritis    Osteoporosis    PONV (postoperative nausea and vomiting)    Tinnitus    Past Surgical History:  Procedure Laterality Date   CARPAL TUNNEL RELEASE Left 01/2021   CERVICAL FUSION  05/2020   COLONOSCOPY     DILATION AND CURETTAGE OF UTERUS  1995   x 2   EUS     HYSTEROSCOPY     LUMBAR FUSION  07/2019   and disc replacement   TUBAL LIGATION  1989   UPPER GASTROINTESTINAL ENDOSCOPY     Patient Active Problem List   Diagnosis Date Noted   Degenerative scoliosis in adult patient 08/22/2019   Internal and external prolapsed hemorrhoids 09/04/2018    PCP: Harlan Stains, MD  REFERRING PROVIDER: Harlan Stains, MD  REFERRING DIAG: M53.3 - SI (sacroiliac) joint dysfunction  Rationale for  Evaluation and Treatment: Rehabilitation  THERAPY DIAG:  SI (sacroiliac) joint dysfunction  Muscle weakness (generalized)  Other abnormalities of gait and mobility  ONSET DATE: At least 3 months ago  SUBJECTIVE:  SUBJECTIVE STATEMENT: My back feels better, the DN really helps. I have no pain when exercising in the pool.   PERTINENT HISTORY:  GERD, OA, osteoporosis, migraines  PAIN:  Are you having pain? No right now  PRECAUTIONS: None  WEIGHT BEARING RESTRICTIONS: No  FALLS:  Has patient fallen in last 6 months? No  LIVING ENVIRONMENT: Lives with: lives with their spouse Lives in: House/apartment Stairs: Yes: External: 2 steps; on right going up Has following equipment at home: Single point cane, shower chair, and Grab bars  OCCUPATION: Retired  PLOF: Independent and Leisure: sewing, teaching ESL to adult immigrants, active in church, puzzles  PATIENT GOALS: To be able to live life with less pain.  NEXT MD VISIT: as needed  OBJECTIVE:   DIAGNOSTIC FINDINGS:  N/A.  Bone density scheduled 01/27/2023  PATIENT SURVEYS:  Eval:  FOTO 52% (projected 63% by visit 12)  SCREENING FOR RED FLAGS: Bowel or bladder incontinence: No Spinal tumors: No Cauda equina syndrome: No Compression fracture: No Abdominal aneurysm: No  COGNITION: Overall cognitive status: Within functional limits for tasks assessed     SENSATION: WFL  MUSCLE LENGTH: Hamstrings: left 70 degrees, right 70 degrees  POSTURE: rounded shoulders and forward head  PALPATION: Tenderness to palpation over right SI joint area  LOWER EXTREMITY MMT:   Eval: Right hip strength of 4+/5 Right hamstring strength of 5-/5 Left LE strength is 5/5 throughout  LUMBAR SPECIAL TESTS:  Eval: Slump test: Positive on right  side, negative on left SI joint compression:  negative SI joint distraction:  positive for pain  FUNCTIONAL TESTS:  01/17/2023: 5 times sit to stand: 9.9 sec Single leg stance:  right- 30 sec (but noted with increased unsteadiness and requires increased movement of LE), left- 30 sec  GAIT: Distance walked: >200 ft Assistive device utilized: None Level of assistance: Complete Independence Comments: At times has antalgic gait as distance progresses  TODAY'S TREATMENT:        02/04/23:Pt arrives for aquatic physical therapy. Treatment took place in 3.5-5.5 feet of water. Water temperature was 91 degrees F. Pt entered the pool via stairs and mild use of rails.. Pt requires buoyancy of water for support and to offload joints with strengthening exercises.  Seated water bench with 75% submersion Pt performed seated LE AROM exercises 20x in all planes, 75% depth water walking 8x each direction with small noodle for support. PPT against wall with RT single knee to chest stretch 3x 20 sec. PPT with single buoy UE weight lat/core press 10x2. High knee marching across pool with small noodle 6x. Normal stance with mitten hands: Vc to contract gluteals while arms press forward and back 10x. Standing knee ext with small noodle 10x Bil, requires UE asst for balance. Hip 3 ways Bil 15x with UE for balance. VC for control and no hip extension into pain. Underwater bicycle with large yellow noodle behind pt 5x min, 1 min decompression to end.  DATE: 02/01/2023  Nustep level 5 x6 min with PT present to discuss status Seated hamstring stretch 2x20 sec bilat Seated piriformis stretch 2x20 sec bilat Seated with 2# around bilat ankles:  heel/toe raises, marching, LAQ, hip ER.  2x10 bilat each Sit to/from Stand holding 5# kettlebell x10 Seated core series with 5# kettlebell:  hip to hip, hip to  shoulder.  X10 bilat each Sidelying SI joint pelvic slide x10 bilat Trigger Point Dry-Needling  Treatment instructions: Expect mild to moderate muscle soreness. S/S of pneumothorax if dry needled over a lung field, and to seek immediate medical attention should they occur. Patient verbalized understanding of these instructions and education. Patient Consent Given: Yes Education handout provided: Yes Muscles treated: bilateral lumbar multifidi Electrical stimulation performed: No Parameters: N/A Treatment response/outcome: Utilized skilled palpation to locate/identify trigger points.  Able to illicit twitch response and muscle elongation.   DATE: 01/25/2023  Nustep level 4 x6 min with PT present to discuss status Seated with 2# around bilat ankles:  heel/toe raises, marching, LAQ, hip ER.  2x10 bilat each Seated piriformis stretch 2x20 sec bilat Seated hamstring stretch 2x20 sec bilat Sit to/from Stand holding 5# dumbbell x10 Seated clamshells with red tband 2x10 Trigger Point Dry-Needling  Treatment instructions: Expect mild to moderate muscle soreness. S/S of pneumothorax if dry needled over a lung field, and to seek immediate medical attention should they occur. Patient verbalized understanding of these instructions and education. Patient Consent Given: Yes Education handout provided: Yes Muscles treated: bilateral lumbar multifidi, bilateral glutes/piriformis Electrical stimulation performed: No Parameters: N/A Treatment response/outcome: Utilized skilled palpation to locate/identify trigger points.  Able to illicit twitch response and muscle elongation. Manual Therapy:  soft tissue mobilization to lumbar paraspinals in prone position to further promote muscle elongation.   PATIENT EDUCATION:  Education details: Issued HEP Person educated: Patient Education method: Explanation, Demonstration, and Handouts Education comprehension: verbalized understanding and returned  demonstration  HOME EXERCISE PROGRAM: Access Code: [email or web address] URL: [email or web address] Date: 02/01/2023 Prepared by: Shelby Dubin Menke  Exercises - Supine Posterior Pelvic Tilt  - 1 x daily - 7 x weekly - 2 sets - 10 reps - Clamshell  - 1 x daily - 7 x weekly - 2 sets - 10 reps - Sidelying Reverse Clamshell  - 1 x daily - 7 x weekly - 2 sets - 10 reps - Side Bicycle  - 1 x daily - 7 x weekly - 2 sets - 10 reps - Seated Hamstring Stretch  - 1 x daily - 7 x weekly - 1 sets - 2 reps - 20 sec hold - Seated Piriformis Stretch  - 1 x daily - 7 x weekly - 1 sets - 2 reps - 20 sec hold - Seated Transversus Abdominis Bracing  - 1 x daily - 7 x weekly - 2 sets - 10 reps - Sit to Stand with Arms Crossed  - 1 x daily - 7 x weekly - 2 sets - 10 reps  ASSESSMENT:  CLINICAL IMPRESSION: Pt arrives for aquatic PT with no pain and reports she continues to improve. Her main complaint today is intermittent twinges in her back. We increased most everything today in pool be it reps, more resistance, or more difficult exercise. Pt had no issue with anything new today. She has decided to not do the [facility] pool because it is too cold. Pt does feel she is benefiting from the pain free exercise while in the pool for her PT.   OBJECTIVE IMPAIRMENTS: decreased balance,  difficulty walking, decreased strength, increased muscle spasms, impaired flexibility, and pain.   ACTIVITY LIMITATIONS: bending, standing, squatting, and stairs  PARTICIPATION LIMITATIONS: driving, community activity, and yard work  PERSONAL FACTORS: Time since onset of injury/illness/exacerbation and 3+ comorbidities: OA, osteoporosis, migraines  are also affecting patient's functional outcome.   REHAB POTENTIAL: Good  CLINICAL DECISION MAKING: Evolving/moderate complexity  EVALUATION COMPLEXITY: Moderate   GOALS: Goals reviewed with patient? Yes  SHORT TERM GOALS: Target date: 02/04/2023  Pt will be independent with initial  HEP. Baseline: Goal status: MET  2.  Patient to report at least a 30% improvement with pain since initial evaluation. Baseline:  Goal status: MET   LONG TERM GOALS: Target date: 03/11/2023  Pt to be independent with advanced HEP and knowledgeable in self progression of HEP. Baseline:  Goal status: IN PROGRESS  2.  Patient to increase FOTO to at least 63% to demonstrate increased ability to perform functional mobility Baseline: 52% Goal status: INITIAL  3.  Patient to increase right hip strength to at least 5-/5 to allow patient to perform yard work with less difficulty. Baseline:  Goal status: INITIAL  4.  Patient to report ability to drive for greater than 45 min without increased pain. Baseline:  Goal status: INITIAL   PLAN:  PT FREQUENCY: 2x/week  PT DURATION: 8 weeks  PLANNED INTERVENTIONS: Therapeutic exercises, Therapeutic activity, Neuromuscular re-education, Balance training, Gait training, Patient/Family education, Self Care, Joint mobilization, Joint manipulation, Stair training, Aquatic Therapy, Dry Needling, Electrical stimulation, Spinal manipulation, Spinal mobilization, Cryotherapy, Moist heat, Taping, Ultrasound, Ionotophoresis 13m/ml Dexamethasone, Manual therapy, and Re-evaluation.  PLAN FOR NEXT SESSION: assess and progress HEP as indicated, strengthening, SI mobilization, aquatic PT, dry needling/manual therapy as indicated   JMyrene Galas PTA 02/04/23 2:54 PM   [facility] Center[facility] [address] [place] [address]Phone # [phone] [phone]

## 2023-02-04 ENCOUNTER — Ambulatory Visit: Payer: Medicare PPO | Admitting: Physical Therapy

## 2023-02-04 ENCOUNTER — Encounter: Payer: Self-pay | Admitting: Physical Therapy

## 2023-02-04 DIAGNOSIS — M6281 Muscle weakness (generalized): Secondary | ICD-10-CM | POA: Diagnosis not present

## 2023-02-04 DIAGNOSIS — M533 Sacrococcygeal disorders, not elsewhere classified: Secondary | ICD-10-CM

## 2023-02-04 DIAGNOSIS — R2689 Other abnormalities of gait and mobility: Secondary | ICD-10-CM | POA: Diagnosis not present

## 2023-02-08 ENCOUNTER — Encounter: Payer: Self-pay | Admitting: Rehabilitative and Restorative Service Providers"

## 2023-02-08 ENCOUNTER — Ambulatory Visit: Payer: Medicare PPO | Admitting: Rehabilitative and Restorative Service Providers"

## 2023-02-08 DIAGNOSIS — R2689 Other abnormalities of gait and mobility: Secondary | ICD-10-CM

## 2023-02-08 DIAGNOSIS — M533 Sacrococcygeal disorders, not elsewhere classified: Secondary | ICD-10-CM

## 2023-02-08 DIAGNOSIS — M6281 Muscle weakness (generalized): Secondary | ICD-10-CM | POA: Diagnosis not present

## 2023-02-08 NOTE — Therapy (Signed)
OUTPATIENT PHYSICAL THERAPY  TREATMENT NOTE   Patient Name: Audrey Powers MRN: [id number] DOB:1951/02/27, 72 y.o., female Today's Date: 02/08/2023  END OF SESSION:  PT End of Session - 02/08/23 1449     Visit Number 6    Date for PT Re-Evaluation 03/11/23    Authorization Type Medicare Cohere    Authorization Time Period 01/17/2023 - 03/11/2023    Authorization - Visit Number 6    Authorization - Number of Visits 16    Progress Note Due on Visit 10    PT Start Time [id number]    PT Stop Time 1528    PT Time Calculation (min) 43 min    Activity Tolerance Patient tolerated treatment well    Behavior During Therapy [facility] for tasks assessed/performed             Past Medical History:  Diagnosis Date   Anxiety    Arthritis    Bunion    DDD (degenerative disc disease), cervical    Degenerative scoliosis in adult patient 08/22/2019   Depression    Endometrial polyp    Fibrocystic breast changes    Fundic gland polyps of stomach, benign 08/2016   GERD (gastroesophageal reflux disease)    Headache, migraine    Hirsutism    Hyperlipidemia    IBS (irritable bowel syndrome)    Internal and external prolapsed hemorrhoids 09/04/2018   Iron deficiency anemia    Osteoarthritis    Osteoporosis    PONV (postoperative nausea and vomiting)    Tinnitus    Past Surgical History:  Procedure Laterality Date   CARPAL TUNNEL RELEASE Left 01/2021   CERVICAL FUSION  05/2020   COLONOSCOPY     DILATION AND CURETTAGE OF UTERUS  1995   x 2   EUS     HYSTEROSCOPY     LUMBAR FUSION  07/2019   and disc replacement   TUBAL LIGATION  1989   UPPER GASTROINTESTINAL ENDOSCOPY     Patient Active Problem List   Diagnosis Date Noted   Degenerative scoliosis in adult patient 08/22/2019   Internal and external prolapsed hemorrhoids 09/04/2018    PCP: Harlan Stains, MD  REFERRING PROVIDER: Harlan Stains, MD  REFERRING DIAG: M53.3 - SI (sacroiliac) joint dysfunction  Rationale for  Evaluation and Treatment: Rehabilitation  THERAPY DIAG:  SI (sacroiliac) joint dysfunction  Muscle weakness (generalized)  Other abnormalities of gait and mobility  ONSET DATE: At least 3 months ago  SUBJECTIVE:  SUBJECTIVE STATEMENT: Patient reports some pain with sitting and having a difficult time getting comfortable.  Patient reports that benefits from dry needling seemed to last until Sunday when she was sitting on the church pew.  PERTINENT HISTORY:  GERD, OA, osteoporosis, migraines  PAIN:  Are you having pain? Yes: NPRS scale: 3/10 Pain location: right SI joint Pain description: aching Aggravating factors: yard work, prolonged driving Relieving factors: lying down, medication  PRECAUTIONS: None  WEIGHT BEARING RESTRICTIONS: No  FALLS:  Has patient fallen in last 6 months? No  LIVING ENVIRONMENT: Lives with: lives with their spouse Lives in: House/apartment Stairs: Yes: External: 2 steps; on right going up Has following equipment at home: Single point cane, shower chair, and Grab bars  OCCUPATION: Retired  PLOF: Independent and Leisure: sewing, teaching ESL to adult immigrants, active in church, puzzles  PATIENT GOALS: To be able to live life with less pain.  NEXT MD VISIT: as needed  OBJECTIVE:   DIAGNOSTIC FINDINGS:  N/A.  Bone density scheduled 01/27/2023  PATIENT SURVEYS:  Eval:  FOTO 52% (projected 63% by visit 12)  MUSCLE LENGTH: Hamstrings: left 70 degrees, right 70 degrees  POSTURE: rounded shoulders and forward head  PALPATION: Tenderness to palpation over right SI joint area  LOWER EXTREMITY MMT:   Eval: Right hip strength of 4+/5 Right hamstring strength of 5-/5 Left LE strength is 5/5 throughout  LUMBAR SPECIAL TESTS:  Eval: Slump test: Positive on  right side, negative on left SI joint compression:  negative SI joint distraction:  positive for pain  FUNCTIONAL TESTS:  01/17/2023: 5 times sit to stand: 9.9 sec Single leg stance:  right- 30 sec (but noted with increased unsteadiness and requires increased movement of LE), left- 30 sec  GAIT: Distance walked: >200 ft Assistive device utilized: None Level of assistance: Complete Independence Comments: At times has antalgic gait as distance progresses  TODAY'S TREATMENT:       DATE: 02/08/2023  Nustep level 5 x6 min with PT present to discuss status Seated hamstring stretch 2x20 sec bilat Seated piriformis stretch 2x20 sec bilat Seated with 2.5# around bilat ankles:  heel/toe raises, marching, LAQ, hip ER.  2x10 bilat each Sit to/from Stand while standing on purple foam holding 5# kettlebell 2x10 Sidelying SI joint pelvic slide x10 bilat Sidelying clamshells with yellow tband 2x10 bilat Trigger Point Dry-Needling  Treatment instructions: Expect mild to moderate muscle soreness. S/S of pneumothorax if dry needled over a lung field, and to seek immediate medical attention should they occur. Patient verbalized understanding of these instructions and education. Patient Consent Given: Yes Education handout provided: Yes Muscles treated: bilateral lumbar multifidi, bilateral glutes/piriformis Electrical stimulation performed: No Parameters: N/A Treatment response/outcome: Utilized skilled palpation to locate/identify trigger points.  Able to illicit twitch response and muscle elongation.   02/04/23: Pt arrives for aquatic physical therapy. Treatment took place in 3.5-5.5 feet of water. Water temperature was 91 degrees F. Pt entered the pool via stairs and mild use of rails.. Pt requires buoyancy of water for support and to offload joints with strengthening exercises.  Seated water bench with 75% submersion Pt performed seated LE AROM exercises 20x in all planes, 75% depth water walking 8x  each direction with small noodle for support. PPT against wall with RT single knee to chest stretch 3x 20 sec. PPT with single buoy UE weight lat/core press 10x2. High knee marching across pool with small noodle 6x. Normal stance with mitten hands: Vc to contract gluteals while arms  press forward and back 10x. Standing knee ext with small noodle 10x Bil, requires UE asst for balance. Hip 3 ways Bil 15x with UE for balance. VC for control and no hip extension into pain. Underwater bicycle with large yellow noodle behind pt 5x min, 1 min decompression to end.                                                                                                                             DATE: 02/01/2023  Nustep level 5 x6 min with PT present to discuss status Seated hamstring stretch 2x20 sec bilat Seated piriformis stretch 2x20 sec bilat Seated with 2# around bilat ankles:  heel/toe raises, marching, LAQ, hip ER.  2x10 bilat each Sit to/from Stand holding 5# kettlebell x10 Seated core series with 5# kettlebell:  hip to hip, hip to shoulder.  X10 bilat each Sidelying SI joint pelvic slide x10 bilat Trigger Point Dry-Needling  Treatment instructions: Expect mild to moderate muscle soreness. S/S of pneumothorax if dry needled over a lung field, and to seek immediate medical attention should they occur. Patient verbalized understanding of these instructions and education. Patient Consent Given: Yes Education handout provided: Yes Muscles treated: bilateral lumbar multifidi Electrical stimulation performed: No Parameters: N/A Treatment response/outcome: Utilized skilled palpation to locate/identify trigger points.  Able to illicit twitch response and muscle elongation.   DATE: 01/25/2023  Nustep level 4 x6 min with PT present to discuss status Seated with 2# around bilat ankles:  heel/toe raises, marching, LAQ, hip ER.  2x10 bilat each Seated piriformis stretch 2x20 sec bilat Seated hamstring stretch 2x20  sec bilat Sit to/from Stand holding 5# dumbbell x10 Seated clamshells with red tband 2x10 Trigger Point Dry-Needling  Treatment instructions: Expect mild to moderate muscle soreness. S/S of pneumothorax if dry needled over a lung field, and to seek immediate medical attention should they occur. Patient verbalized understanding of these instructions and education. Patient Consent Given: Yes Education handout provided: Yes Muscles treated: bilateral lumbar multifidi, bilateral glutes/piriformis Electrical stimulation performed: No Parameters: N/A Treatment response/outcome: Utilized skilled palpation to locate/identify trigger points.  Able to illicit twitch response and muscle elongation. Manual Therapy:  soft tissue mobilization to lumbar paraspinals in prone position to further promote muscle elongation.   PATIENT EDUCATION:  Education details: Issued HEP Person educated: Patient Education method: Explanation, Demonstration, and Handouts Education comprehension: verbalized understanding and returned demonstration  HOME EXERCISE PROGRAM: Access Code: [email or web address] URL: [email or web address] Date: 02/01/2023 Prepared by: Juel Burrow  Exercises - Supine Posterior Pelvic Tilt  - 1 x daily - 7 x weekly - 2 sets - 10 reps - Clamshell  - 1 x daily - 7 x weekly - 2 sets - 10 reps - Sidelying Reverse Clamshell  - 1 x daily - 7 x weekly - 2 sets - 10 reps - Side Bicycle  - 1 x daily - 7 x weekly - 2 sets - 10 reps - Seated Hamstring Stretch  -  1 x daily - 7 x weekly - 1 sets - 2 reps - 20 sec hold - Seated Piriformis Stretch  - 1 x daily - 7 x weekly - 1 sets - 2 reps - 20 sec hold - Seated Transversus Abdominis Bracing  - 1 x daily - 7 x weekly - 2 sets - 10 reps - Sit to Stand with Arms Crossed  - 1 x daily - 7 x weekly - 2 sets - 10 reps  ASSESSMENT:  CLINICAL IMPRESSION: Ms Levere presents to skilled PT reporting that she has made at least 30% improvement since initial  evaluation.  Patient reports that the dry  needling seems to have helped, but she does still have pain with sidelying on right side or with some seated positions.  Patient able to progress with increased weight and to use yellow tband in clamshell exercise.  Provided yellow theraband for home use.  Patient continues to report decreased pain with dry needling/manual therapy.  Patient states that the aquatic PT has been helping with decreased pain, as well.   OBJECTIVE IMPAIRMENTS: decreased balance, difficulty walking, decreased strength, increased muscle spasms, impaired flexibility, and pain.   ACTIVITY LIMITATIONS: bending, standing, squatting, and stairs  PARTICIPATION LIMITATIONS: driving, community activity, and yard work  PERSONAL FACTORS: Time since onset of injury/illness/exacerbation and 3+ comorbidities: OA, osteoporosis, migraines  are also affecting patient's functional outcome.   REHAB POTENTIAL: Good  CLINICAL DECISION MAKING: Evolving/moderate complexity  EVALUATION COMPLEXITY: Moderate   GOALS: Goals reviewed with patient? Yes  SHORT TERM GOALS: Target date: 02/04/2023  Pt will be independent with initial HEP. Baseline: Goal status: MET  2.  Patient to report at least a 30% improvement with pain since initial evaluation. Baseline:  Goal status: MET   LONG TERM GOALS: Target date: 03/11/2023  Pt to be independent with advanced HEP and knowledgeable in self progression of HEP. Baseline:  Goal status: IN PROGRESS  2.  Patient to increase FOTO to at least 63% to demonstrate increased ability to perform functional mobility Baseline: 52% Goal status: IN PROGRESS  3.  Patient to increase right hip strength to at least 5-/5 to allow patient to perform yard work with less difficulty. Baseline:  Goal status: IN PROGRESS  4.  Patient to report ability to drive for greater than 45 min without increased pain. Baseline:  Goal status: INITIAL   PLAN:  PT FREQUENCY:  2x/week  PT DURATION: 8 weeks  PLANNED INTERVENTIONS: Therapeutic exercises, Therapeutic activity, Neuromuscular re-education, Balance training, Gait training, Patient/Family education, Self Care, Joint mobilization, Joint manipulation, Stair training, Aquatic Therapy, Dry Needling, Electrical stimulation, Spinal manipulation, Spinal mobilization, Cryotherapy, Moist heat, Taping, Ultrasound, Ionotophoresis 41m/ml Dexamethasone, Manual therapy, and Re-evaluation.  PLAN FOR NEXT SESSION: assess and progress HEP as indicated, strengthening, SI mobilization, aquatic PT, dry needling/manual therapy as indicated   SJuel Burrow PT 02/08/23 3:38 PM   [facility] Center[facility] [address] [place] [address]Phone # [phone] [phone]

## 2023-02-10 NOTE — Therapy (Signed)
OUTPATIENT PHYSICAL THERAPY  TREATMENT NOTE   Patient Name: Audrey Powers MRN: [id number] DOB:10-15-1951, 72 y.o., female Today's Date: 02/11/2023  END OF SESSION:  PT End of Session - 02/11/23 1403     Visit Number 7    Date for PT Re-Evaluation 03/11/23    Authorization Type Medicare [employer]    Authorization Time Period 01/17/2023 - 03/11/2023    Authorization - Visit Number 7    Authorization - Number of Visits 16    Progress Note Due on Visit 10    PT Start Time 1300    PT Stop Time [id number]    PT Time Calculation (min) 55 min    Activity Tolerance Patient tolerated treatment well              Past Medical History:  Diagnosis Date   Anxiety    Arthritis    Bunion    DDD (degenerative disc disease), cervical    Degenerative scoliosis in adult patient 08/22/2019   Depression    Endometrial polyp    Fibrocystic breast changes    Fundic gland polyps of stomach, benign 08/2016   GERD (gastroesophageal reflux disease)    Headache, migraine    Hirsutism    Hyperlipidemia    IBS (irritable bowel syndrome)    Internal and external prolapsed hemorrhoids 09/04/2018   Iron deficiency anemia    Osteoarthritis    Osteoporosis    PONV (postoperative nausea and vomiting)    Tinnitus    Past Surgical History:  Procedure Laterality Date   CARPAL TUNNEL RELEASE Left 01/2021   CERVICAL FUSION  05/2020   COLONOSCOPY     DILATION AND CURETTAGE OF UTERUS  1995   x 2   EUS     HYSTEROSCOPY     LUMBAR FUSION  07/2019   and disc replacement   TUBAL LIGATION  1989   UPPER GASTROINTESTINAL ENDOSCOPY     Patient Active Problem List   Diagnosis Date Noted   Degenerative scoliosis in adult patient 08/22/2019   Internal and external prolapsed hemorrhoids 09/04/2018    PCP: Harlan Stains, MD  REFERRING PROVIDER: Harlan Stains, MD  REFERRING DIAG: M53.3 - SI (sacroiliac) joint dysfunction  Rationale for Evaluation and Treatment: Rehabilitation  THERAPY DIAG:  SI  (sacroiliac) joint dysfunction  Muscle weakness (generalized)  Other abnormalities of gait and mobility  ONSET DATE: At least 3 months ago  SUBJECTIVE:  SUBJECTIVE STATEMENT: Did well with dry needling. I think I am liking the pool exercises more than I thought I would. Pain is intermittent and overall less intense.  PERTINENT HISTORY:  GERD, OA, osteoporosis, migraines  PAIN:  Are you having pain? NO: NPRS scale: 0/10 Pain location:  Pain description: aching Aggravating factors: yard work, prolonged driving Relieving factors: lying down, medication  PRECAUTIONS: None  WEIGHT BEARING RESTRICTIONS: No  FALLS:  Has patient fallen in last 6 months? No  LIVING ENVIRONMENT: Lives with: lives with their spouse Lives in: House/apartment Stairs: Yes: External: 2 steps; on right going up Has following equipment at home: Single point cane, shower chair, and Grab bars  OCCUPATION: Retired  PLOF: Independent and Leisure: sewing, teaching ESL to adult immigrants, active in church, puzzles  PATIENT GOALS: To be able to live life with less pain.  NEXT MD VISIT: as needed  OBJECTIVE:   DIAGNOSTIC FINDINGS:  N/A.  Bone density scheduled 01/27/2023  PATIENT SURVEYS:  Eval:  FOTO 52% (projected 63% by visit 12)  MUSCLE LENGTH: Hamstrings: left 70 degrees, right 70 degrees  POSTURE: rounded shoulders and forward head  PALPATION: Tenderness to palpation over right SI joint area  LOWER EXTREMITY MMT:   Eval: Right hip strength of 4+/5 Right hamstring strength of 5-/5 Left LE strength is 5/5 throughout  LUMBAR SPECIAL TESTS:  Eval: Slump test: Positive on right side, negative on left SI joint compression:  negative SI joint distraction:  positive for pain  FUNCTIONAL TESTS:   01/17/2023: 5 times sit to stand: 9.9 sec Single leg stance:  right- 30 sec (but noted with increased unsteadiness and requires increased movement of LE), left- 30 sec  GAIT: Distance walked: >200 ft Assistive device utilized: None Level of assistance: Complete Independence Comments: At times has antalgic gait as distance progresses  TODAY'S TREATMENT:   02/11/23:Pt arrives for aquatic physical therapy. Treatment took place in 3.5-5.5 feet of water. Water temperature was 91 degrees F. Pt entered the pool via stairs and mild use of rails.. Pt requires buoyancy of water for support and to offload joints with strengthening exercises.  Seated water bench with 75% submersion Pt performed seated LE AROM exercises 20x in all planes, 75% depth water walking 8x each direction with small noodle for support. PPT against wall with RT single knee to chest stretch 3x 20 sec. PPT with double buoy UE weight lat/core press 10x2. High knee marching across pool with small noodle 6x. Normal stance with water bells: Vc to contract gluteals while arms press forward and back 10x, then in tandem stance 10x.  Standing knee ext with small noodle 10x Bil, requires UE asst for balance. Hip 3 ways Bil 15x with UE for balance added ankle fins for resistance. VC for control and no hip extension into pain. Underwater bicycle with large yellow noodle behind pt 5x min, 2 min decompression to end.        DATE: 02/08/2023  Nustep level 5 x6 min with PT present to discuss status Seated hamstring stretch 2x20 sec bilat Seated piriformis stretch 2x20 sec bilat Seated with 2.5# around bilat ankles:  heel/toe raises, marching, LAQ, hip ER.  2x10 bilat each Sit to/from Stand while standing on purple foam holding 5# kettlebell 2x10 Sidelying SI joint pelvic slide x10 bilat Sidelying clamshells with yellow tband 2x10 bilat Trigger Point Dry-Needling  Treatment instructions: Expect mild to moderate muscle soreness. S/S of pneumothorax  if dry needled over a lung field, and to seek immediate  medical attention should they occur. Patient verbalized understanding of these instructions and education. Patient Consent Given: Yes Education handout provided: Yes Muscles treated: bilateral lumbar multifidi, bilateral glutes/piriformis Electrical stimulation performed: No Parameters: N/A Treatment response/outcome: Utilized skilled palpation to locate/identify trigger points.  Able to illicit twitch response and muscle elongation.   02/04/23: Pt arrives for aquatic physical therapy. Treatment took place in 3.5-5.5 feet of water. Water temperature was 91 degrees F. Pt entered the pool via stairs and mild use of rails.. Pt requires buoyancy of water for support and to offload joints with strengthening exercises.  Seated water bench with 75% submersion Pt performed seated LE AROM exercises 20x in all planes, 75% depth water walking 8x each direction with small noodle for support. PPT against wall with RT single knee to chest stretch 3x 20 sec. PPT with single buoy UE weight lat/core press 10x2. High knee marching across pool with small noodle 6x. Normal stance with mitten hands: Vc to contract gluteals while arms press forward and back 10x. Standing knee ext with small noodle 10x Bil, requires UE asst for balance. Hip 3 ways Bil 15x with UE for balance. VC for control and no hip extension into pain. Underwater bicycle with large yellow noodle behind pt 5x min, 1 min decompression to end.                                                                                                                             PATIENT EDUCATION:  Education details: Issued HEP Person educated: Patient Education method: Explanation, Demonstration, and Handouts Education comprehension: verbalized understanding and returned demonstration  HOME EXERCISE PROGRAM: Access Code: [email or web address] URL: [email or web address] Date: 02/01/2023 Prepared by: Shelby Dubin  Menke  Exercises - Supine Posterior Pelvic Tilt  - 1 x daily - 7 x weekly - 2 sets - 10 reps - Clamshell  - 1 x daily - 7 x weekly - 2 sets - 10 reps - Sidelying Reverse Clamshell  - 1 x daily - 7 x weekly - 2 sets - 10 reps - Side Bicycle  - 1 x daily - 7 x weekly - 2 sets - 10 reps - Seated Hamstring Stretch  - 1 x daily - 7 x weekly - 1 sets - 2 reps - 20 sec hold - Seated Piriformis Stretch  - 1 x daily - 7 x weekly - 1 sets - 2 reps - 20 sec hold - Seated Transversus Abdominis Bracing  - 1 x daily - 7 x weekly - 2 sets - 10 reps - Sit to Stand with Arms Crossed  - 1 x daily - 7 x weekly - 2 sets - 10 reps  ASSESSMENT:  CLINICAL IMPRESSION:Pt arrives to aquatic PT with no pain. She describes pain as being intermittent and overall less in intensity. Pt reports she is appreciating the pool exercises more than she thought. She reports it feels good when she exercises and is enjoyable. Core strength was main focus  today as she moved her LE.   OBJECTIVE IMPAIRMENTS: decreased balance, difficulty walking, decreased strength, increased muscle spasms, impaired flexibility, and pain.   ACTIVITY LIMITATIONS: bending, standing, squatting, and stairs  PARTICIPATION LIMITATIONS: driving, community activity, and yard work  PERSONAL FACTORS: Time since onset of injury/illness/exacerbation and 3+ comorbidities: OA, osteoporosis, migraines  are also affecting patient's functional outcome.   REHAB POTENTIAL: Good  CLINICAL DECISION MAKING: Evolving/moderate complexity  EVALUATION COMPLEXITY: Moderate   GOALS: Goals reviewed with patient? Yes  SHORT TERM GOALS: Target date: 02/04/2023  Pt will be independent with initial HEP. Baseline: Goal status: MET  2.  Patient to report at least a 30% improvement with pain since initial evaluation. Baseline:  Goal status: MET   LONG TERM GOALS: Target date: 03/11/2023  Pt to be independent with advanced HEP and knowledgeable in self progression of  HEP. Baseline:  Goal status: IN PROGRESS  2.  Patient to increase FOTO to at least 63% to demonstrate increased ability to perform functional mobility Baseline: 52% Goal status: IN PROGRESS  3.  Patient to increase right hip strength to at least 5-/5 to allow patient to perform yard work with less difficulty. Baseline:  Goal status: IN PROGRESS  4.  Patient to report ability to drive for greater than 45 min without increased pain. Baseline:  Goal status: INITIAL   PLAN:  PT FREQUENCY: 2x/week  PT DURATION: 8 weeks  PLANNED INTERVENTIONS: Therapeutic exercises, Therapeutic activity, Neuromuscular re-education, Balance training, Gait training, Patient/Family education, Self Care, Joint mobilization, Joint manipulation, Stair training, Aquatic Therapy, Dry Needling, Electrical stimulation, Spinal manipulation, Spinal mobilization, Cryotherapy, Moist heat, Taping, Ultrasound, Ionotophoresis 10m/ml Dexamethasone, Manual therapy, and Re-evaluation.  PLAN FOR NEXT SESSION: Land next then pt goes out of town .  JMyrene Galas PTA 02/11/23 2:10 PM   [facility] [address] [place] [place] [address]Phone # [phone] [phone]

## 2023-02-11 ENCOUNTER — Encounter: Payer: Self-pay | Admitting: Physical Therapy

## 2023-02-11 ENCOUNTER — Ambulatory Visit: Payer: Medicare PPO | Admitting: Physical Therapy

## 2023-02-11 DIAGNOSIS — M6281 Muscle weakness (generalized): Secondary | ICD-10-CM | POA: Diagnosis not present

## 2023-02-11 DIAGNOSIS — M533 Sacrococcygeal disorders, not elsewhere classified: Secondary | ICD-10-CM | POA: Diagnosis not present

## 2023-02-11 DIAGNOSIS — R2689 Other abnormalities of gait and mobility: Secondary | ICD-10-CM

## 2023-02-15 ENCOUNTER — Encounter: Payer: Self-pay | Admitting: Rehabilitative and Restorative Service Providers"

## 2023-02-15 ENCOUNTER — Ambulatory Visit: Payer: Medicare PPO | Admitting: Rehabilitative and Restorative Service Providers"

## 2023-02-15 DIAGNOSIS — M6281 Muscle weakness (generalized): Secondary | ICD-10-CM

## 2023-02-15 DIAGNOSIS — R2689 Other abnormalities of gait and mobility: Secondary | ICD-10-CM

## 2023-02-15 DIAGNOSIS — M533 Sacrococcygeal disorders, not elsewhere classified: Secondary | ICD-10-CM | POA: Diagnosis not present

## 2023-02-15 NOTE — Therapy (Signed)
OUTPATIENT PHYSICAL THERAPY  TREATMENT NOTE   Patient Name: Audrey Powers MRN: [id number] DOB:April 24, 1951, 72 y.o., female Today's Date: 02/15/2023  END OF SESSION:  PT End of Session - 02/15/23 1447     Visit Number 8    Date for PT Re-Evaluation 03/11/23    Authorization Type Medicare [employer]    Authorization Time Period 01/17/2023 - 03/11/2023    Authorization - Visit Number 8    Authorization - Number of Visits 16    Progress Note Due on Visit 10    PT Start Time [id number]    PT Stop Time 1525    PT Time Calculation (min) 40 min    Activity Tolerance Patient tolerated treatment well    Behavior During Therapy [facility] for tasks assessed/performed              Past Medical History:  Diagnosis Date   Anxiety    Arthritis    Bunion    DDD (degenerative disc disease), cervical    Degenerative scoliosis in adult patient 08/22/2019   Depression    Endometrial polyp    Fibrocystic breast changes    Fundic gland polyps of stomach, benign 08/2016   GERD (gastroesophageal reflux disease)    Headache, migraine    Hirsutism    Hyperlipidemia    IBS (irritable bowel syndrome)    Internal and external prolapsed hemorrhoids 09/04/2018   Iron deficiency anemia    Osteoarthritis    Osteoporosis    PONV (postoperative nausea and vomiting)    Tinnitus    Past Surgical History:  Procedure Laterality Date   CARPAL TUNNEL RELEASE Left 01/2021   CERVICAL FUSION  05/2020   COLONOSCOPY     DILATION AND CURETTAGE OF UTERUS  1995   x 2   EUS     HYSTEROSCOPY     LUMBAR FUSION  07/2019   and disc replacement   TUBAL LIGATION  1989   UPPER GASTROINTESTINAL ENDOSCOPY     Patient Active Problem List   Diagnosis Date Noted   Degenerative scoliosis in adult patient 08/22/2019   Internal and external prolapsed hemorrhoids 09/04/2018    PCP: Harlan Stains, MD  REFERRING PROVIDER: Harlan Stains, MD  REFERRING DIAG: M53.3 - SI (sacroiliac) joint dysfunction  Rationale for  Evaluation and Treatment: Rehabilitation  THERAPY DIAG:  SI (sacroiliac) joint dysfunction  Muscle weakness (generalized)  Other abnormalities of gait and mobility  ONSET DATE: At least 3 months ago  SUBJECTIVE:  SUBJECTIVE STATEMENT: Pt reports that she is overall feeling better, but does still have some pain occasionally.  PERTINENT HISTORY:  GERD, OA, osteoporosis, migraines  PAIN:  Are you having pain? NO: NPRS scale: 2/10 Pain location:  Pain description: aching Aggravating factors: yard work, prolonged driving Relieving factors: lying down, medication  PRECAUTIONS: None  WEIGHT BEARING RESTRICTIONS: No  FALLS:  Has patient fallen in last 6 months? No  LIVING ENVIRONMENT: Lives with: lives with their spouse Lives in: House/apartment Stairs: Yes: External: 2 steps; on right going up Has following equipment at home: Single point cane, shower chair, and Grab bars  OCCUPATION: Retired  PLOF: Independent and Leisure: sewing, teaching ESL to adult immigrants, active in church, puzzles  PATIENT GOALS: To be able to live life with less pain.  NEXT MD VISIT: as needed  OBJECTIVE:   DIAGNOSTIC FINDINGS:  N/A.  Bone density scheduled 01/27/2023  PATIENT SURVEYS:  Eval:  FOTO 52% (projected 63% by visit 12) 02/15/2023:  69% (goal met)  MUSCLE LENGTH: Hamstrings: left 70 degrees, right 70 degrees  POSTURE: rounded shoulders and forward head  PALPATION: Tenderness to palpation over right SI joint area  LOWER EXTREMITY MMT:   Eval: Right hip strength of 4+/5 Right hamstring strength of 5-/5 Left LE strength is 5/5 throughout  LUMBAR SPECIAL TESTS:  Eval: Slump test: Positive on right side, negative on left SI joint compression:  negative SI joint distraction:  positive for  pain  FUNCTIONAL TESTS:  01/17/2023: 5 times sit to stand: 9.9 sec Single leg stance:  right- 30 sec (but noted with increased unsteadiness and requires increased movement of LE), left- 30 sec  GAIT: Distance walked: >200 ft Assistive device utilized: None Level of assistance: Complete Independence Comments: At times has antalgic gait as distance progresses  TODAY'S TREATMENT:   DATE: 02/15/2023  Nustep level 5 x6 min with PT present to discuss status FOTO Seated hamstring stretch 2x20 sec bilat Seated piriformis stretch 2x20 sec bilat Sit to/from Stand while standing on purple foam holding 5# dumbbell 2x10 Standing at barre with blue loop around ankles:  hip abduction, hip extension, mini-squats.  2x10 bilat each Sitting on green pball:  CW and CCW pelvic circles x20 each way Trigger Point Dry-Needling  Treatment instructions: Expect mild to moderate muscle soreness. S/S of pneumothorax if dry needled over a lung field, and to seek immediate medical attention should they occur. Patient verbalized understanding of these instructions and education. Patient Consent Given: Yes Education handout provided: Yes Muscles treated: bilateral lumbar multifidi, bilateral glutes/piriformis Electrical stimulation performed: No Parameters: N/A Treatment response/outcome: Utilized skilled palpation to locate/identify trigger points.  Able to illicit twitch response and muscle elongation.   02/11/23:Pt arrives for aquatic physical therapy. Treatment took place in 3.5-5.5 feet of water. Water temperature was 91 degrees F. Pt entered the pool via stairs and mild use of rails.. Pt requires buoyancy of water for support and to offload joints with strengthening exercises.  Seated water bench with 75% submersion Pt performed seated LE AROM exercises 20x in all planes, 75% depth water walking 8x each direction with small noodle for support. PPT against wall with RT single knee to chest stretch 3x 20 sec. PPT  with double buoy UE weight lat/core press 10x2. High knee marching across pool with small noodle 6x. Normal stance with water bells: Vc to contract gluteals while arms press forward and back 10x, then in tandem stance 10x.  Standing knee ext with small noodle 10x Bil, requires  UE asst for balance. Hip 3 ways Bil 15x with UE for balance added ankle fins for resistance. VC for control and no hip extension into pain. Underwater bicycle with large yellow noodle behind pt 5x min, 2 min decompression to end.        DATE: 02/08/2023  Nustep level 5 x6 min with PT present to discuss status Seated hamstring stretch 2x20 sec bilat Seated piriformis stretch 2x20 sec bilat Seated with 2.5# around bilat ankles:  heel/toe raises, marching, LAQ, hip ER.  2x10 bilat each Sit to/from Stand while standing on purple foam holding 5# kettlebell 2x10 Sidelying SI joint pelvic slide x10 bilat Sidelying clamshells with yellow tband 2x10 bilat Trigger Point Dry-Needling  Treatment instructions: Expect mild to moderate muscle soreness. S/S of pneumothorax if dry needled over a lung field, and to seek immediate medical attention should they occur. Patient verbalized understanding of these instructions and education. Patient Consent Given: Yes Education handout provided: Yes Muscles treated: bilateral lumbar multifidi, bilateral glutes/piriformis Electrical stimulation performed: No Parameters: N/A Treatment response/outcome: Utilized skilled palpation to locate/identify trigger points.  Able to illicit twitch response and muscle elongation.                                                                                                                            PATIENT EDUCATION:  Education details: Issued HEP Person educated: Patient Education method: Explanation, Demonstration, and Handouts Education comprehension: verbalized understanding and returned demonstration  HOME EXERCISE PROGRAM: Access Code:  [email or web address] URL: [email or web address] Date: 02/01/2023 Prepared by: Shelby Dubin Saadia Dewitt  Exercises - Supine Posterior Pelvic Tilt  - 1 x daily - 7 x weekly - 2 sets - 10 reps - Clamshell  - 1 x daily - 7 x weekly - 2 sets - 10 reps - Sidelying Reverse Clamshell  - 1 x daily - 7 x weekly - 2 sets - 10 reps - Side Bicycle  - 1 x daily - 7 x weekly - 2 sets - 10 reps - Seated Hamstring Stretch  - 1 x daily - 7 x weekly - 1 sets - 2 reps - 20 sec hold - Seated Piriformis Stretch  - 1 x daily - 7 x weekly - 1 sets - 2 reps - 20 sec hold - Seated Transversus Abdominis Bracing  - 1 x daily - 7 x weekly - 2 sets - 10 reps - Sit to Stand with Arms Crossed  - 1 x daily - 7 x weekly - 2 sets - 10 reps  ASSESSMENT:  CLINICAL IMPRESSION: Ms Harden presents to skilled PT reporting overall that she can see improvements, and reports decreased overall pain.  Patient continues to report relief of pain with aquatics and dry needling.  Performed FOTO and pt has met long-term goal for this measure.  Patient able to progress with standing exercises with blue loop during session and able to demonstrate good balance with pelvic circles on physioball.  Patient continues to progress towards  goal related activities.   OBJECTIVE IMPAIRMENTS: decreased balance, difficulty walking, decreased strength, increased muscle spasms, impaired flexibility, and pain.   ACTIVITY LIMITATIONS: bending, standing, squatting, and stairs  PARTICIPATION LIMITATIONS: driving, community activity, and yard work  PERSONAL FACTORS: Time since onset of injury/illness/exacerbation and 3+ comorbidities: OA, osteoporosis, migraines  are also affecting patient's functional outcome.   REHAB POTENTIAL: Good  CLINICAL DECISION MAKING: Evolving/moderate complexity  EVALUATION COMPLEXITY: Moderate   GOALS: Goals reviewed with patient? Yes  SHORT TERM GOALS: Target date: 02/04/2023  Pt will be independent with initial  HEP. Baseline: Goal status: MET  2.  Patient to report at least a 30% improvement with pain since initial evaluation. Baseline:  Goal status: MET   LONG TERM GOALS: Target date: 03/11/2023  Pt to be independent with advanced HEP and knowledgeable in self progression of HEP. Baseline:  Goal status: IN PROGRESS  2.  Patient to increase FOTO to at least 63% to demonstrate increased ability to perform functional mobility Baseline: 52% Goal status: MET on 02/15/2023  3.  Patient to increase right hip strength to at least 5-/5 to allow patient to perform yard work with less difficulty. Baseline:  Goal status: IN PROGRESS  4.  Patient to report ability to drive for greater than 45 min without increased pain. Baseline:  Goal status: INITIAL   PLAN:  PT FREQUENCY: 2x/week  PT DURATION: 8 weeks  PLANNED INTERVENTIONS: Therapeutic exercises, Therapeutic activity, Neuromuscular re-education, Balance training, Gait training, Patient/Family education, Self Care, Joint mobilization, Joint manipulation, Stair training, Aquatic Therapy, Dry Needling, Electrical stimulation, Spinal manipulation, Spinal mobilization, Cryotherapy, Moist heat, Taping, Ultrasound, Ionotophoresis 32m/ml Dexamethasone, Manual therapy, and Re-evaluation.  PLAN FOR NEXT SESSION: 10th visit reassessment (on visit 9 due to going to the pool for visit 10), [facility] 02/15/23 3:42 PM   [id number] CityGSt[address] [place] [address]Phone # [phone] [phone]

## 2023-02-18 ENCOUNTER — Ambulatory Visit: Payer: Medicare PPO | Admitting: Physical Therapy

## 2023-02-21 DIAGNOSIS — K1379 Other lesions of oral mucosa: Secondary | ICD-10-CM | POA: Diagnosis not present

## 2023-02-21 DIAGNOSIS — R000 Tachycardia, unspecified: Secondary | ICD-10-CM | POA: Diagnosis not present

## 2023-02-22 ENCOUNTER — Ambulatory Visit: Payer: Medicare PPO | Admitting: Rehabilitative and Restorative Service Providers"

## 2023-02-22 ENCOUNTER — Encounter: Payer: Self-pay | Admitting: Rehabilitative and Restorative Service Providers"

## 2023-02-22 DIAGNOSIS — R2689 Other abnormalities of gait and mobility: Secondary | ICD-10-CM | POA: Diagnosis not present

## 2023-02-22 DIAGNOSIS — M6281 Muscle weakness (generalized): Secondary | ICD-10-CM

## 2023-02-22 DIAGNOSIS — M533 Sacrococcygeal disorders, not elsewhere classified: Secondary | ICD-10-CM | POA: Diagnosis not present

## 2023-02-22 NOTE — Therapy (Signed)
OUTPATIENT PHYSICAL THERAPY  TREATMENT NOTE AND DISCHARGE SUMMARY   Patient Name: Audrey Powers MRN: [id number] DOB:March 20, 1951, 72 y.o., female Today's Date: 02/22/2023   Progress Note Reporting Period 01/17/2023 to 02/22/2023  See note below for Objective Data and Assessment of Progress/Goals.       END OF SESSION:  PT End of Session - 02/22/23 1445     Visit Number 9    Date for PT Re-Evaluation 03/11/23    Authorization Type [employer]    Authorization Time Period 01/17/2023 - 03/11/2023    Authorization - Visit Number 9    Authorization - Number of Visits 16    Progress Note Due on Visit 10    PT Start Time [id number]    PT Stop Time 1521    PT Time Calculation (min) 38 min    Activity Tolerance Patient tolerated treatment well    Behavior During Therapy [facility] for tasks assessed/performed              Past Medical History:  Diagnosis Date   Anxiety    Arthritis    Bunion    DDD (degenerative disc disease), cervical    Degenerative scoliosis in adult patient 08/22/2019   Depression    Endometrial polyp    Fibrocystic breast changes    Fundic gland polyps of stomach, benign 08/2016   GERD (gastroesophageal reflux disease)    Headache, migraine    Hirsutism    Hyperlipidemia    IBS (irritable bowel syndrome)    Internal and external prolapsed hemorrhoids 09/04/2018   Iron deficiency anemia    Osteoarthritis    Osteoporosis    PONV (postoperative nausea and vomiting)    Tinnitus    Past Surgical History:  Procedure Laterality Date   CARPAL TUNNEL RELEASE Left 01/2021   CERVICAL FUSION  05/2020   COLONOSCOPY     DILATION AND CURETTAGE OF UTERUS  1995   x 2   EUS     HYSTEROSCOPY     LUMBAR FUSION  07/2019   and disc replacement   TUBAL LIGATION  1989   UPPER GASTROINTESTINAL ENDOSCOPY     Patient Active Problem List   Diagnosis Date Noted   Degenerative scoliosis in adult patient 08/22/2019   Internal and external prolapsed hemorrhoids  09/04/2018    PCP: Harlan Stains, MD  REFERRING PROVIDER: Harlan Stains, MD  REFERRING DIAG: M53.3 - SI (sacroiliac) joint dysfunction  Rationale for Evaluation and Treatment: Rehabilitation  THERAPY DIAG:  SI (sacroiliac) joint dysfunction  Muscle weakness (generalized)  Other abnormalities of gait and mobility  ONSET DATE: At least 3 months ago  SUBJECTIVE:  SUBJECTIVE STATEMENT: Pt reports her drive went well and she denies pain at this current time.  PERTINENT HISTORY:  GERD, OA, osteoporosis, migraines  PAIN:  Are you having pain? NO: NPRS scale: 0/10 Pain location:  Pain description: aching Aggravating factors: yard work, prolonged driving Relieving factors: lying down, medication  PRECAUTIONS: None  WEIGHT BEARING RESTRICTIONS: No  FALLS:  Has patient fallen in last 6 months? No  LIVING ENVIRONMENT: Lives with: lives with their spouse Lives in: House/apartment Stairs: Yes: External: 2 steps; on right going up Has following equipment at home: Single point cane, shower chair, and Grab bars  OCCUPATION: Retired  PLOF: Independent and Leisure: sewing, teaching ESL to adult immigrants, active in church, puzzles  PATIENT GOALS: To be able to live life with less pain.  NEXT MD VISIT: as needed  OBJECTIVE:   DIAGNOSTIC FINDINGS:  N/A.  Bone density scheduled 01/27/2023  PATIENT SURVEYS:  Eval:  FOTO 52% (projected 63% by visit 12) 02/15/2023:  69% (goal met)  MUSCLE LENGTH: Hamstrings: left 70 degrees, right 70 degrees  POSTURE: rounded shoulders and forward head  PALPATION: Tenderness to palpation over right SI joint area  LOWER EXTREMITY MMT:   Eval: Right hip strength of 4+/5 Right hamstring strength of 5-/5 Left LE strength is 5/5  throughout  02/22/2023: BLE strength grossly 5/5 throughout  LUMBAR SPECIAL TESTS:  Eval: Slump test: Positive on right side, negative on left SI joint compression:  negative SI joint distraction:  positive for pain  FUNCTIONAL TESTS:  01/17/2023: 5 times sit to stand: 9.9 sec Single leg stance:  right- 30 sec (but noted with increased unsteadiness and requires increased movement of LE), left- 30 sec  GAIT: Distance walked: >200 ft Assistive device utilized: None Level of assistance: Complete Independence Comments: At times has antalgic gait as distance progresses  TODAY'S TREATMENT:   DATE: 02/22/2023  Nustep level 5 x6 min with PT present to discuss status Seated hamstring stretch 2x20 sec bilat Seated piriformis stretch 2x20 sec bilat Sit to/from Stand while standing on purple foam holding 5# dumbbell 2x10 Seated on dynadisc:  CW and CCW pelvic circles x20 each way Single leg stance on flat side of half foam bolster 2x30 sec each (occasional UE support) Rocker board x1 min Leg press 80# 2x10 Standing hip hike from 2" step 2x10 bilat Forward T 2x5 bilat Side stepping 3x10 ft bilat Tandem stance 3x10 ft   DATE: 02/15/2023  Nustep level 5 x6 min with PT present to discuss status FOTO Seated hamstring stretch 2x20 sec bilat Seated piriformis stretch 2x20 sec bilat Sit to/from Stand while standing on purple foam holding 5# dumbbell 2x10 Standing at barre with blue loop around ankles:  hip abduction, hip extension, mini-squats.  2x10 bilat each Sitting on green pball:  CW and CCW pelvic circles x20 each way Trigger Point Dry-Needling  Treatment instructions: Expect mild to moderate muscle soreness. S/S of pneumothorax if dry needled over a lung field, and to seek immediate medical attention should they occur. Patient verbalized understanding of these instructions and education. Patient Consent Given: Yes Education handout provided: Yes Muscles treated: bilateral lumbar  multifidi, bilateral glutes/piriformis Electrical stimulation performed: No Parameters: N/A Treatment response/outcome: Utilized skilled palpation to locate/identify trigger points.  Able to illicit twitch response and muscle elongation.   02/11/23:Pt arrives for aquatic physical therapy. Treatment took place in 3.5-5.5 feet of water. Water temperature was 91 degrees F. Pt entered the pool via stairs and mild use of rails.. Pt requires  buoyancy of water for support and to offload joints with strengthening exercises.  Seated water bench with 75% submersion Pt performed seated LE AROM exercises 20x in all planes, 75% depth water walking 8x each direction with small noodle for support. PPT against wall with RT single knee to chest stretch 3x 20 sec. PPT with double buoy UE weight lat/core press 10x2. High knee marching across pool with small noodle 6x. Normal stance with water bells: Vc to contract gluteals while arms press forward and back 10x, then in tandem stance 10x.  Standing knee ext with small noodle 10x Bil, requires UE asst for balance. Hip 3 ways Bil 15x with UE for balance added ankle fins for resistance. VC for control and no hip extension into pain. Underwater bicycle with large yellow noodle behind pt 5x min, 2 min decompression to end.                                                                                                                                   PATIENT EDUCATION:  Education details: Issued HEP Person educated: Patient Education method: Explanation, Demonstration, and Handouts Education comprehension: verbalized understanding and returned demonstration  HOME EXERCISE PROGRAM: Access Code: [email or web address] URL: [email or web address] Date: 02/22/2023 Prepared by: Juel Burrow  Exercises - Supine Posterior Pelvic Tilt  - 1 x daily - 7 x weekly - 2 sets - 10 reps - Clamshell  - 1 x daily - 7 x weekly - 2 sets - 10 reps - Sidelying Reverse Clamshell  - 1 x  daily - 7 x weekly - 2 sets - 10 reps - Side Bicycle  - 1 x daily - 7 x weekly - 2 sets - 10 reps - Seated Hamstring Stretch  - 1 x daily - 7 x weekly - 1 sets - 2 reps - 20 sec hold - Seated Piriformis Stretch  - 1 x daily - 7 x weekly - 1 sets - 2 reps - 20 sec hold - Seated Transversus Abdominis Bracing  - 1 x daily - 7 x weekly - 2 sets - 10 reps - Sit to Stand with Arms Crossed  - 1 x daily - 7 x weekly - 2 sets - 10 reps - Hip Hiking on Step  - 1 x daily - 7 x weekly - 2 sets - 10 reps - Single Leg Stance  - 1 x daily - 7 x weekly - 1 sets - 2 reps - 30 sec hold - Forward T  - 1 x daily - 7 x weekly - 2 sets - 5 reps - Sidestepping  - 1 x daily - 7 x weekly - 3 sets - 10 reps - Tandem Walking  - 1 x daily - 7 x weekly - 3 sets - 10 reps  ASSESSMENT:  CLINICAL IMPRESSION: Ms Horne presents to skilled PT reporting that she did well with her long car trip and denies any current pain.  Patient states that she feels that she can continue with HEP to maintain her gains.  Patient has increased her hip strength and has improved with her overall balance.  Patient has met all long term goals and was able to safely demonstrate HEP without difficulty or reports of pain.  Patient discharged from skilled outpatient PT at this time to continue with HEP.   OBJECTIVE IMPAIRMENTS: decreased balance, difficulty walking, decreased strength, increased muscle spasms, impaired flexibility, and pain.   ACTIVITY LIMITATIONS: bending, standing, squatting, and stairs  PARTICIPATION LIMITATIONS: driving, community activity, and yard work  PERSONAL FACTORS: Time since onset of injury/illness/exacerbation and 3+ comorbidities: OA, osteoporosis, migraines  are also affecting patient's functional outcome.   REHAB POTENTIAL: Good  CLINICAL DECISION MAKING: Evolving/moderate complexity  EVALUATION COMPLEXITY: Moderate   GOALS: Goals reviewed with patient? Yes  SHORT TERM GOALS: Target date:  02/04/2023  Pt will be independent with initial HEP. Baseline: Goal status: MET  2.  Patient to report at least a 30% improvement with pain since initial evaluation. Baseline:  Goal status: MET   LONG TERM GOALS: Target date: 03/11/2023  Pt to be independent with advanced HEP and knowledgeable in self progression of HEP. Baseline:  Goal status: MET  2.  Patient to increase FOTO to at least 63% to demonstrate increased ability to perform functional mobility Baseline: 52% Goal status: MET on 02/15/2023  3.  Patient to increase right hip strength to at least 5-/5 to allow patient to perform yard work with less difficulty. Baseline:  Goal status: MET  4.  Patient to report ability to drive for greater than 45 min without increased pain. Baseline:  Goal status: MET   PLAN:  PT FREQUENCY: 2x/week  PT DURATION: 8 weeks  PLANNED INTERVENTIONS: Therapeutic exercises, Therapeutic activity, Neuromuscular re-education, Balance training, Gait training, Patient/Family education, Self Care, Joint mobilization, Joint manipulation, Stair training, Aquatic Therapy, Dry Needling, Electrical stimulation, Spinal manipulation, Spinal mobilization, Cryotherapy, Moist heat, Taping, Ultrasound, Ionotophoresis '4mg'$ /ml Dexamethasone, Manual therapy, and Re-evaluation.   PHYSICAL THERAPY DISCHARGE SUMMARY  Patient agrees to discharge. Patient goals were met. Patient is being discharged due to meeting the stated rehab goals.    Juel Burrow, PT 02/22/23 3:30 PM   [facility] [address] [place], [place] [address] Phone # [phone] Fax [phone]

## 2023-02-25 ENCOUNTER — Ambulatory Visit: Payer: Medicare PPO | Admitting: Physical Therapy

## 2023-03-01 ENCOUNTER — Encounter: Payer: Medicare PPO | Admitting: Rehabilitative and Restorative Service Providers"

## 2023-03-04 ENCOUNTER — Ambulatory Visit: Payer: Medicare PPO | Admitting: Physical Therapy

## 2023-03-08 ENCOUNTER — Ambulatory Visit: Payer: Medicare PPO | Admitting: Rehabilitative and Restorative Service Providers"

## 2023-06-15 DIAGNOSIS — M810 Age-related osteoporosis without current pathological fracture: Secondary | ICD-10-CM | POA: Diagnosis not present

## 2023-06-21 DIAGNOSIS — H5213 Myopia, bilateral: Secondary | ICD-10-CM | POA: Diagnosis not present

## 2023-06-21 DIAGNOSIS — H2513 Age-related nuclear cataract, bilateral: Secondary | ICD-10-CM | POA: Diagnosis not present

## 2023-06-21 DIAGNOSIS — H52203 Unspecified astigmatism, bilateral: Secondary | ICD-10-CM | POA: Diagnosis not present

## 2023-07-18 DIAGNOSIS — L813 Cafe au lait spots: Secondary | ICD-10-CM | POA: Diagnosis not present

## 2023-07-18 DIAGNOSIS — L819 Disorder of pigmentation, unspecified: Secondary | ICD-10-CM | POA: Diagnosis not present

## 2023-07-18 DIAGNOSIS — L821 Other seborrheic keratosis: Secondary | ICD-10-CM | POA: Diagnosis not present

## 2023-07-18 DIAGNOSIS — D2262 Melanocytic nevi of left upper limb, including shoulder: Secondary | ICD-10-CM | POA: Diagnosis not present

## 2023-07-18 DIAGNOSIS — L720 Epidermal cyst: Secondary | ICD-10-CM | POA: Diagnosis not present

## 2023-07-18 DIAGNOSIS — D2261 Melanocytic nevi of right upper limb, including shoulder: Secondary | ICD-10-CM | POA: Diagnosis not present

## 2023-07-18 DIAGNOSIS — L918 Other hypertrophic disorders of the skin: Secondary | ICD-10-CM | POA: Diagnosis not present

## 2023-07-18 DIAGNOSIS — L814 Other melanin hyperpigmentation: Secondary | ICD-10-CM | POA: Diagnosis not present

## 2023-08-01 DIAGNOSIS — K581 Irritable bowel syndrome with constipation: Secondary | ICD-10-CM | POA: Diagnosis not present

## 2023-08-01 DIAGNOSIS — Z0000 Encounter for general adult medical examination without abnormal findings: Secondary | ICD-10-CM | POA: Diagnosis not present

## 2023-08-01 DIAGNOSIS — M810 Age-related osteoporosis without current pathological fracture: Secondary | ICD-10-CM | POA: Diagnosis not present

## 2023-08-01 DIAGNOSIS — R252 Cramp and spasm: Secondary | ICD-10-CM | POA: Diagnosis not present

## 2023-08-01 DIAGNOSIS — K219 Gastro-esophageal reflux disease without esophagitis: Secondary | ICD-10-CM | POA: Diagnosis not present

## 2023-08-01 DIAGNOSIS — G43009 Migraine without aura, not intractable, without status migrainosus: Secondary | ICD-10-CM | POA: Diagnosis not present

## 2023-08-01 DIAGNOSIS — K644 Residual hemorrhoidal skin tags: Secondary | ICD-10-CM | POA: Diagnosis not present

## 2023-08-01 DIAGNOSIS — F40243 Fear of flying: Secondary | ICD-10-CM | POA: Diagnosis not present

## 2023-08-01 DIAGNOSIS — E785 Hyperlipidemia, unspecified: Secondary | ICD-10-CM | POA: Diagnosis not present

## 2023-09-13 DIAGNOSIS — K644 Residual hemorrhoidal skin tags: Secondary | ICD-10-CM | POA: Diagnosis not present

## 2023-09-27 ENCOUNTER — Other Ambulatory Visit: Payer: Self-pay | Admitting: General Surgery

## 2023-09-27 DIAGNOSIS — K644 Residual hemorrhoidal skin tags: Secondary | ICD-10-CM | POA: Diagnosis not present

## 2023-09-27 DIAGNOSIS — L919 Hypertrophic disorder of the skin, unspecified: Secondary | ICD-10-CM | POA: Diagnosis not present

## 2023-09-29 LAB — DERMATOLOGY PATHOLOGY

## 2023-10-21 DIAGNOSIS — L290 Pruritus ani: Secondary | ICD-10-CM | POA: Diagnosis not present

## 2023-11-10 DIAGNOSIS — J0100 Acute maxillary sinusitis, unspecified: Secondary | ICD-10-CM | POA: Diagnosis not present

## 2023-11-28 ENCOUNTER — Other Ambulatory Visit: Payer: Self-pay | Admitting: Family Medicine

## 2023-11-28 DIAGNOSIS — Z1231 Encounter for screening mammogram for malignant neoplasm of breast: Secondary | ICD-10-CM

## 2023-12-05 DIAGNOSIS — L282 Other prurigo: Secondary | ICD-10-CM | POA: Diagnosis not present

## 2023-12-05 DIAGNOSIS — L308 Other specified dermatitis: Secondary | ICD-10-CM | POA: Diagnosis not present

## 2023-12-07 DIAGNOSIS — M810 Age-related osteoporosis without current pathological fracture: Secondary | ICD-10-CM | POA: Diagnosis not present

## 2023-12-14 DIAGNOSIS — M810 Age-related osteoporosis without current pathological fracture: Secondary | ICD-10-CM | POA: Diagnosis not present

## 2023-12-26 ENCOUNTER — Ambulatory Visit
Admission: RE | Admit: 2023-12-26 | Discharge: 2023-12-26 | Disposition: A | Discharge status: Home / Self Care | Payer: Medicare PPO | Source: Ambulatory Visit

## 2023-12-26 DIAGNOSIS — Z1231 Encounter for screening mammogram for malignant neoplasm of breast: Secondary | ICD-10-CM

## 2024-01-12 DIAGNOSIS — M545 Low back pain, unspecified: Secondary | ICD-10-CM | POA: Diagnosis not present

## 2024-01-20 DIAGNOSIS — M5451 Vertebrogenic low back pain: Secondary | ICD-10-CM | POA: Diagnosis not present

## 2024-01-23 DIAGNOSIS — M5451 Vertebrogenic low back pain: Secondary | ICD-10-CM | POA: Diagnosis not present

## 2024-01-30 DIAGNOSIS — M5451 Vertebrogenic low back pain: Secondary | ICD-10-CM | POA: Diagnosis not present

## 2024-02-01 DIAGNOSIS — M5451 Vertebrogenic low back pain: Secondary | ICD-10-CM | POA: Diagnosis not present

## 2024-02-06 DIAGNOSIS — M5451 Vertebrogenic low back pain: Secondary | ICD-10-CM | POA: Diagnosis not present

## 2024-02-08 DIAGNOSIS — M5451 Vertebrogenic low back pain: Secondary | ICD-10-CM | POA: Diagnosis not present

## 2024-02-13 DIAGNOSIS — M5451 Vertebrogenic low back pain: Secondary | ICD-10-CM | POA: Diagnosis not present

## 2024-02-16 DIAGNOSIS — M5451 Vertebrogenic low back pain: Secondary | ICD-10-CM | POA: Diagnosis not present

## 2024-02-20 DIAGNOSIS — M5451 Vertebrogenic low back pain: Secondary | ICD-10-CM | POA: Diagnosis not present

## 2024-02-22 DIAGNOSIS — M5451 Vertebrogenic low back pain: Secondary | ICD-10-CM | POA: Diagnosis not present

## 2024-02-29 DIAGNOSIS — M5451 Vertebrogenic low back pain: Secondary | ICD-10-CM | POA: Diagnosis not present

## 2024-03-02 DIAGNOSIS — M5451 Vertebrogenic low back pain: Secondary | ICD-10-CM | POA: Diagnosis not present

## 2024-03-05 DIAGNOSIS — M5451 Vertebrogenic low back pain: Secondary | ICD-10-CM | POA: Diagnosis not present

## 2024-03-06 DIAGNOSIS — L820 Inflamed seborrheic keratosis: Secondary | ICD-10-CM | POA: Diagnosis not present

## 2024-03-06 DIAGNOSIS — L918 Other hypertrophic disorders of the skin: Secondary | ICD-10-CM | POA: Diagnosis not present

## 2024-03-06 DIAGNOSIS — L821 Other seborrheic keratosis: Secondary | ICD-10-CM | POA: Diagnosis not present

## 2024-03-06 DIAGNOSIS — L813 Cafe au lait spots: Secondary | ICD-10-CM | POA: Diagnosis not present

## 2024-03-07 DIAGNOSIS — M5451 Vertebrogenic low back pain: Secondary | ICD-10-CM | POA: Diagnosis not present

## 2024-03-12 DIAGNOSIS — M5451 Vertebrogenic low back pain: Secondary | ICD-10-CM | POA: Diagnosis not present

## 2024-03-14 DIAGNOSIS — M5451 Vertebrogenic low back pain: Secondary | ICD-10-CM | POA: Diagnosis not present

## 2024-03-19 DIAGNOSIS — M5451 Vertebrogenic low back pain: Secondary | ICD-10-CM | POA: Diagnosis not present

## 2024-03-26 DIAGNOSIS — M5451 Vertebrogenic low back pain: Secondary | ICD-10-CM | POA: Diagnosis not present

## 2024-04-02 DIAGNOSIS — M5451 Vertebrogenic low back pain: Secondary | ICD-10-CM | POA: Diagnosis not present

## 2024-04-09 DIAGNOSIS — M5451 Vertebrogenic low back pain: Secondary | ICD-10-CM | POA: Diagnosis not present

## 2024-04-16 DIAGNOSIS — M5451 Vertebrogenic low back pain: Secondary | ICD-10-CM | POA: Diagnosis not present

## 2024-04-23 DIAGNOSIS — M5451 Vertebrogenic low back pain: Secondary | ICD-10-CM | POA: Diagnosis not present

## 2024-04-30 DIAGNOSIS — M5451 Vertebrogenic low back pain: Secondary | ICD-10-CM | POA: Diagnosis not present

## 2024-05-07 DIAGNOSIS — M5451 Vertebrogenic low back pain: Secondary | ICD-10-CM | POA: Diagnosis not present

## 2024-05-22 DIAGNOSIS — M533 Sacrococcygeal disorders, not elsewhere classified: Secondary | ICD-10-CM | POA: Diagnosis not present

## 2024-06-04 DIAGNOSIS — M533 Sacrococcygeal disorders, not elsewhere classified: Secondary | ICD-10-CM | POA: Diagnosis not present

## 2024-06-15 DIAGNOSIS — S0502XA Injury of conjunctiva and corneal abrasion without foreign body, left eye, initial encounter: Secondary | ICD-10-CM | POA: Diagnosis not present

## 2024-06-18 DIAGNOSIS — M810 Age-related osteoporosis without current pathological fracture: Secondary | ICD-10-CM | POA: Diagnosis not present

## 2024-06-25 DIAGNOSIS — H5213 Myopia, bilateral: Secondary | ICD-10-CM | POA: Diagnosis not present

## 2024-06-25 DIAGNOSIS — H2513 Age-related nuclear cataract, bilateral: Secondary | ICD-10-CM | POA: Diagnosis not present

## 2024-06-25 DIAGNOSIS — M810 Age-related osteoporosis without current pathological fracture: Secondary | ICD-10-CM | POA: Diagnosis not present

## 2024-07-24 DIAGNOSIS — M4712 Other spondylosis with myelopathy, cervical region: Secondary | ICD-10-CM | POA: Diagnosis not present

## 2024-07-24 DIAGNOSIS — Z981 Arthrodesis status: Secondary | ICD-10-CM | POA: Diagnosis not present

## 2024-07-24 DIAGNOSIS — M533 Sacrococcygeal disorders, not elsewhere classified: Secondary | ICD-10-CM | POA: Diagnosis not present

## 2024-07-24 DIAGNOSIS — M48062 Spinal stenosis, lumbar region with neurogenic claudication: Secondary | ICD-10-CM | POA: Diagnosis not present

## 2024-08-16 ENCOUNTER — Other Ambulatory Visit (HOSPITAL_BASED_OUTPATIENT_CLINIC_OR_DEPARTMENT_OTHER): Payer: Self-pay | Admitting: Family Medicine

## 2024-08-16 DIAGNOSIS — G43009 Migraine without aura, not intractable, without status migrainosus: Secondary | ICD-10-CM | POA: Diagnosis not present

## 2024-08-16 DIAGNOSIS — R0789 Other chest pain: Secondary | ICD-10-CM

## 2024-08-16 DIAGNOSIS — Z0000 Encounter for general adult medical examination without abnormal findings: Secondary | ICD-10-CM | POA: Diagnosis not present

## 2024-08-16 DIAGNOSIS — K219 Gastro-esophageal reflux disease without esophagitis: Secondary | ICD-10-CM | POA: Diagnosis not present

## 2024-08-16 DIAGNOSIS — F338 Other recurrent depressive disorders: Secondary | ICD-10-CM | POA: Diagnosis not present

## 2024-08-16 DIAGNOSIS — F40243 Fear of flying: Secondary | ICD-10-CM | POA: Diagnosis not present

## 2024-08-16 DIAGNOSIS — K581 Irritable bowel syndrome with constipation: Secondary | ICD-10-CM | POA: Diagnosis not present

## 2024-08-16 DIAGNOSIS — M5136 Other intervertebral disc degeneration, lumbar region with discogenic back pain only: Secondary | ICD-10-CM | POA: Diagnosis not present

## 2024-08-16 DIAGNOSIS — E785 Hyperlipidemia, unspecified: Secondary | ICD-10-CM | POA: Diagnosis not present

## 2024-08-16 DIAGNOSIS — M810 Age-related osteoporosis without current pathological fracture: Secondary | ICD-10-CM | POA: Diagnosis not present

## 2024-08-17 ENCOUNTER — Other Ambulatory Visit: Payer: Self-pay | Admitting: Family Medicine

## 2024-08-17 DIAGNOSIS — R634 Abnormal weight loss: Secondary | ICD-10-CM

## 2024-08-21 ENCOUNTER — Ambulatory Visit
Admission: RE | Admit: 2024-08-21 | Discharge: 2024-08-21 | Disposition: A | Discharge status: Home / Self Care | Source: Ambulatory Visit | Attending: Family Medicine | Admitting: Family Medicine

## 2024-08-21 DIAGNOSIS — R634 Abnormal weight loss: Secondary | ICD-10-CM

## 2024-08-21 DIAGNOSIS — K5900 Constipation, unspecified: Secondary | ICD-10-CM | POA: Diagnosis not present

## 2024-08-21 DIAGNOSIS — N200 Calculus of kidney: Secondary | ICD-10-CM | POA: Diagnosis not present

## 2024-08-21 MED ORDER — IOPAMIDOL (ISOVUE-300) INJECTION 61%
100.0000 mL | Freq: Once | INTRAVENOUS | Status: AC | PRN
  Administered 2024-08-21: 100 mL via INTRAVENOUS

## 2024-09-04 ENCOUNTER — Ambulatory Visit (HOSPITAL_BASED_OUTPATIENT_CLINIC_OR_DEPARTMENT_OTHER)
Admission: RE | Admit: 2024-09-04 | Discharge: 2024-09-04 | Disposition: A | Discharge status: Home / Self Care | Payer: Self-pay | Source: Ambulatory Visit | Attending: Family Medicine | Admitting: Family Medicine

## 2024-09-04 DIAGNOSIS — R0789 Other chest pain: Secondary | ICD-10-CM | POA: Insufficient documentation

## 2024-09-07 DIAGNOSIS — H10503 Unspecified blepharoconjunctivitis, bilateral: Secondary | ICD-10-CM | POA: Diagnosis not present

## 2024-09-07 DIAGNOSIS — H10413 Chronic giant papillary conjunctivitis, bilateral: Secondary | ICD-10-CM | POA: Diagnosis not present

## 2024-09-13 DIAGNOSIS — M533 Sacrococcygeal disorders, not elsewhere classified: Secondary | ICD-10-CM | POA: Diagnosis not present

## 2024-10-22 DIAGNOSIS — D692 Other nonthrombocytopenic purpura: Secondary | ICD-10-CM | POA: Diagnosis not present

## 2024-10-22 DIAGNOSIS — L821 Other seborrheic keratosis: Secondary | ICD-10-CM | POA: Diagnosis not present

## 2024-10-22 DIAGNOSIS — L299 Pruritus, unspecified: Secondary | ICD-10-CM | POA: Diagnosis not present

## 2024-10-22 DIAGNOSIS — D2272 Melanocytic nevi of left lower limb, including hip: Secondary | ICD-10-CM | POA: Diagnosis not present

## 2024-10-22 DIAGNOSIS — L813 Cafe au lait spots: Secondary | ICD-10-CM | POA: Diagnosis not present

## 2024-11-12 ENCOUNTER — Other Ambulatory Visit: Payer: Self-pay | Admitting: Family Medicine

## 2024-11-12 DIAGNOSIS — Z1231 Encounter for screening mammogram for malignant neoplasm of breast: Secondary | ICD-10-CM

## 2024-12-26 ENCOUNTER — Ambulatory Visit
Admission: RE | Admit: 2024-12-26 | Discharge: 2024-12-26 | Disposition: A | Discharge status: Home / Self Care | Source: Ambulatory Visit | Attending: Family Medicine | Admitting: Family Medicine

## 2024-12-26 DIAGNOSIS — Z1231 Encounter for screening mammogram for malignant neoplasm of breast: Secondary | ICD-10-CM
